# Patient Record
Sex: Female | Born: 1980 | Race: Black or African American | Hispanic: No | Marital: Married | Smoking: Current every day smoker
Health system: Southern US, Community
[De-identification: ages and names within clinical notes are randomized; demographics above are authoritative.]

## PROBLEM LIST (undated history)

## (undated) ENCOUNTER — Emergency Department (HOSPITAL_COMMUNITY): Admission: EM | Disposition: A | Discharge status: Home / Self Care | Payer: BC Managed Care – PPO

## (undated) DIAGNOSIS — F419 Anxiety disorder, unspecified: Secondary | ICD-10-CM

## (undated) DIAGNOSIS — R112 Nausea with vomiting, unspecified: Secondary | ICD-10-CM

## (undated) DIAGNOSIS — F32A Depression, unspecified: Secondary | ICD-10-CM

## (undated) DIAGNOSIS — Z9889 Other specified postprocedural states: Secondary | ICD-10-CM

## (undated) DIAGNOSIS — T7840XA Allergy, unspecified, initial encounter: Secondary | ICD-10-CM

## (undated) DIAGNOSIS — B019 Varicella without complication: Secondary | ICD-10-CM

## (undated) DIAGNOSIS — G43909 Migraine, unspecified, not intractable, without status migrainosus: Secondary | ICD-10-CM

## (undated) DIAGNOSIS — E739 Lactose intolerance, unspecified: Secondary | ICD-10-CM

## (undated) DIAGNOSIS — M545 Low back pain: Secondary | ICD-10-CM

## (undated) DIAGNOSIS — F329 Major depressive disorder, single episode, unspecified: Secondary | ICD-10-CM

## (undated) DIAGNOSIS — G4700 Insomnia, unspecified: Secondary | ICD-10-CM

## (undated) HISTORY — DX: Anxiety disorder, unspecified: F41.9

## (undated) HISTORY — PX: TONSILLECTOMY: SUR1361

## (undated) HISTORY — PX: BACK SURGERY: SHX140

## (undated) HISTORY — DX: Low back pain: M54.5

## (undated) HISTORY — DX: Major depressive disorder, single episode, unspecified: F32.9

## (undated) HISTORY — DX: Depression, unspecified: F32.A

## (undated) HISTORY — DX: Insomnia, unspecified: G47.00

## (undated) HISTORY — PX: TYMPANOSTOMY: SHX2586

## (undated) HISTORY — DX: Allergy, unspecified, initial encounter: T78.40XA

## (undated) HISTORY — DX: Varicella without complication: B01.9

## (undated) HISTORY — DX: Lactose intolerance, unspecified: E73.9

## (undated) HISTORY — DX: Migraine, unspecified, not intractable, without status migrainosus: G43.909

## (undated) HISTORY — PX: TONSILLECTOMY AND ADENOIDECTOMY: SHX28

---
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
## 2000-02-16 ENCOUNTER — Other Ambulatory Visit: Admission: RE | Admit: 2000-02-16 | Discharge: 2000-02-16 | Payer: Self-pay | Admitting: Internal Medicine

## 2000-11-29 DIAGNOSIS — M545 Low back pain, unspecified: Secondary | ICD-10-CM

## 2000-11-29 HISTORY — DX: Low back pain, unspecified: M54.50

## 2000-12-09 ENCOUNTER — Emergency Department (HOSPITAL_COMMUNITY): Admission: EM | Admit: 2000-12-09 | Discharge: 2000-12-09 | Payer: Self-pay | Admitting: Emergency Medicine

## 2000-12-09 ENCOUNTER — Encounter: Payer: Self-pay | Admitting: Emergency Medicine

## 2001-07-10 ENCOUNTER — Emergency Department (HOSPITAL_COMMUNITY): Admission: EM | Admit: 2001-07-10 | Discharge: 2001-07-10 | Payer: Self-pay | Admitting: Emergency Medicine

## 2001-11-07 ENCOUNTER — Other Ambulatory Visit: Admission: RE | Admit: 2001-11-07 | Discharge: 2001-11-07 | Payer: Self-pay | Admitting: Internal Medicine

## 2002-11-23 ENCOUNTER — Other Ambulatory Visit: Admission: RE | Admit: 2002-11-23 | Discharge: 2002-11-23 | Payer: Self-pay | Admitting: Internal Medicine

## 2004-12-17 ENCOUNTER — Other Ambulatory Visit: Admission: RE | Admit: 2004-12-17 | Discharge: 2004-12-17 | Payer: Self-pay | Admitting: Internal Medicine

## 2004-12-17 ENCOUNTER — Ambulatory Visit: Payer: Self-pay | Admitting: Internal Medicine

## 2005-01-26 ENCOUNTER — Ambulatory Visit: Payer: Self-pay | Admitting: Internal Medicine

## 2005-02-09 ENCOUNTER — Ambulatory Visit: Payer: Self-pay | Admitting: Family Medicine

## 2005-02-12 ENCOUNTER — Encounter: Admission: RE | Admit: 2005-02-12 | Discharge: 2005-02-12 | Payer: Self-pay | Admitting: Family Medicine

## 2005-03-11 ENCOUNTER — Ambulatory Visit: Payer: Self-pay | Admitting: Internal Medicine

## 2005-05-11 ENCOUNTER — Ambulatory Visit: Payer: Self-pay | Admitting: Internal Medicine

## 2005-06-07 ENCOUNTER — Ambulatory Visit: Payer: Self-pay | Admitting: Internal Medicine

## 2005-07-20 ENCOUNTER — Ambulatory Visit: Payer: Self-pay | Admitting: Family Medicine

## 2005-08-30 ENCOUNTER — Ambulatory Visit: Payer: Self-pay | Admitting: Internal Medicine

## 2005-09-13 ENCOUNTER — Ambulatory Visit: Payer: Self-pay | Admitting: Internal Medicine

## 2005-11-19 ENCOUNTER — Ambulatory Visit: Payer: Self-pay | Admitting: Internal Medicine

## 2005-12-20 ENCOUNTER — Ambulatory Visit: Payer: Self-pay | Admitting: Internal Medicine

## 2005-12-27 ENCOUNTER — Ambulatory Visit: Payer: Self-pay | Admitting: Internal Medicine

## 2005-12-27 ENCOUNTER — Other Ambulatory Visit: Admission: RE | Admit: 2005-12-27 | Discharge: 2005-12-27 | Payer: Self-pay | Admitting: Internal Medicine

## 2005-12-27 ENCOUNTER — Encounter: Payer: Self-pay | Admitting: Internal Medicine

## 2006-04-06 ENCOUNTER — Emergency Department (HOSPITAL_COMMUNITY): Admission: EM | Admit: 2006-04-06 | Discharge: 2006-04-06 | Payer: Self-pay | Admitting: Emergency Medicine

## 2006-08-22 ENCOUNTER — Emergency Department (HOSPITAL_COMMUNITY): Admission: EM | Admit: 2006-08-22 | Discharge: 2006-08-23 | Payer: Self-pay | Admitting: Emergency Medicine

## 2006-10-11 ENCOUNTER — Ambulatory Visit: Payer: Self-pay | Admitting: Internal Medicine

## 2007-07-28 ENCOUNTER — Emergency Department (HOSPITAL_COMMUNITY): Admission: EM | Admit: 2007-07-28 | Discharge: 2007-07-28 | Payer: Self-pay | Admitting: Emergency Medicine

## 2007-08-04 ENCOUNTER — Emergency Department (HOSPITAL_COMMUNITY): Admission: EM | Admit: 2007-08-04 | Discharge: 2007-08-04 | Payer: Self-pay | Admitting: Emergency Medicine

## 2007-09-11 ENCOUNTER — Emergency Department (HOSPITAL_COMMUNITY): Admission: EM | Admit: 2007-09-11 | Discharge: 2007-09-11 | Payer: Self-pay | Admitting: Emergency Medicine

## 2007-09-14 ENCOUNTER — Ambulatory Visit: Payer: Self-pay | Admitting: Internal Medicine

## 2007-09-20 ENCOUNTER — Ambulatory Visit: Payer: Self-pay | Admitting: *Deleted

## 2008-01-04 ENCOUNTER — Ambulatory Visit: Payer: Self-pay | Admitting: Internal Medicine

## 2008-01-04 ENCOUNTER — Encounter: Payer: Self-pay | Admitting: Internal Medicine

## 2008-01-04 LAB — CONVERTED CEMR LAB
BUN: 6 mg/dL (ref 6–23)
CO2: 24 meq/L (ref 19–32)
Calcium: 8.7 mg/dL (ref 8.4–10.5)
Chlamydia, DNA Probe: NEGATIVE
Chloride: 107 meq/L (ref 96–112)
Cholesterol: 162 mg/dL (ref 0–200)
Creatinine, Ser: 0.72 mg/dL (ref 0.40–1.20)
GC Probe Amp, Genital: NEGATIVE
Glucose, Bld: 94 mg/dL (ref 70–99)
HDL: 44 mg/dL (ref >39)
LDL Cholesterol: 87 mg/dL (ref 0–99)
Potassium: 4.3 meq/L (ref 3.5–5.3)
Preg, Serum: NEGATIVE
Sodium: 142 meq/L (ref 135–145)
Total CHOL/HDL Ratio: 3.7
Triglycerides: 153 mg/dL — ABNORMAL HIGH (ref <150)
VLDL: 31 mg/dL (ref 0–40)

## 2008-01-05 ENCOUNTER — Ambulatory Visit: Payer: Self-pay | Admitting: Internal Medicine

## 2008-02-06 ENCOUNTER — Ambulatory Visit: Payer: Self-pay | Admitting: Internal Medicine

## 2008-03-22 ENCOUNTER — Emergency Department (HOSPITAL_COMMUNITY): Admission: EM | Admit: 2008-03-22 | Discharge: 2008-03-22 | Payer: Self-pay | Admitting: Emergency Medicine

## 2008-03-28 ENCOUNTER — Ambulatory Visit: Payer: Self-pay | Admitting: Internal Medicine

## 2008-03-29 ENCOUNTER — Ambulatory Visit: Payer: Self-pay | Admitting: Internal Medicine

## 2008-05-08 ENCOUNTER — Ambulatory Visit: Payer: Self-pay | Admitting: Internal Medicine

## 2008-05-21 ENCOUNTER — Emergency Department (HOSPITAL_COMMUNITY): Admission: EM | Admit: 2008-05-21 | Discharge: 2008-05-21 | Payer: Self-pay | Admitting: Emergency Medicine

## 2008-05-23 ENCOUNTER — Ambulatory Visit: Payer: Self-pay | Admitting: Internal Medicine

## 2008-05-30 ENCOUNTER — Ambulatory Visit: Payer: Self-pay | Admitting: Internal Medicine

## 2008-06-19 ENCOUNTER — Ambulatory Visit: Payer: Self-pay | Admitting: Internal Medicine

## 2008-09-07 ENCOUNTER — Emergency Department (HOSPITAL_COMMUNITY): Admission: EM | Admit: 2008-09-07 | Discharge: 2008-09-07 | Payer: Self-pay | Admitting: Emergency Medicine

## 2008-10-07 ENCOUNTER — Emergency Department (HOSPITAL_COMMUNITY): Admission: EM | Admit: 2008-10-07 | Discharge: 2008-10-07 | Payer: Self-pay | Admitting: Family Medicine

## 2008-10-30 ENCOUNTER — Ambulatory Visit: Payer: Self-pay | Admitting: Internal Medicine

## 2008-10-30 LAB — CONVERTED CEMR LAB
BUN: 9 mg/dL (ref 6–23)
CO2: 22 meq/L (ref 19–32)
Glucose, Bld: 91 mg/dL (ref 70–99)
HCT: 41.7 % (ref 36.0–46.0)
Lymphocytes Relative: 31 % (ref 12–46)
Lymphs Abs: 2.8 10*3/uL (ref 0.7–4.0)
MCV: 85.3 fL (ref 78.0–100.0)
Monocytes Relative: 8 % (ref 3–12)
Neutrophils Relative %: 58 % (ref 43–77)
Platelets: 354 10*3/uL (ref 150–400)
Potassium: 4 meq/L (ref 3.5–5.3)
RBC: 4.89 M/uL (ref 3.87–5.11)
Sodium: 142 meq/L (ref 135–145)
WBC: 9.1 10*3/uL (ref 4.0–10.5)

## 2008-12-18 ENCOUNTER — Ambulatory Visit: Payer: Self-pay | Admitting: Internal Medicine

## 2008-12-18 ENCOUNTER — Encounter: Payer: Self-pay | Admitting: Internal Medicine

## 2008-12-18 LAB — CONVERTED CEMR LAB
BUN: 6 mg/dL (ref 6–23)
CO2: 21 meq/L (ref 19–32)
Calcium: 9.1 mg/dL (ref 8.4–10.5)
Chlamydia, DNA Probe: NEGATIVE
Chloride: 108 meq/L (ref 96–112)
Creatinine, Ser: 0.71 mg/dL (ref 0.40–1.20)
GC Probe Amp, Genital: NEGATIVE
Glucose, Bld: 94 mg/dL (ref 70–99)
HDL: 43 mg/dL (ref >39)
LDL Cholesterol: 93 mg/dL (ref 0–99)
TSH: 0.712 microintl units/mL (ref 0.350–4.50)

## 2009-07-02 ENCOUNTER — Emergency Department (HOSPITAL_COMMUNITY): Admission: EM | Admit: 2009-07-02 | Discharge: 2009-07-02 | Payer: Self-pay | Admitting: Family Medicine

## 2009-11-25 ENCOUNTER — Ambulatory Visit: Payer: Self-pay | Admitting: Family Medicine

## 2009-11-25 ENCOUNTER — Other Ambulatory Visit: Admission: RE | Admit: 2009-11-25 | Discharge: 2009-11-25 | Payer: Self-pay | Admitting: Family Medicine

## 2009-12-23 ENCOUNTER — Ambulatory Visit: Payer: Self-pay | Admitting: Family Medicine

## 2010-03-24 ENCOUNTER — Ambulatory Visit: Payer: Self-pay | Admitting: Family Medicine

## 2010-06-17 ENCOUNTER — Other Ambulatory Visit: Admission: RE | Admit: 2010-06-17 | Discharge: 2010-06-17 | Payer: Self-pay | Admitting: Internal Medicine

## 2010-06-17 ENCOUNTER — Ambulatory Visit: Payer: Self-pay | Admitting: Internal Medicine

## 2010-06-17 LAB — CONVERTED CEMR LAB
CO2: 26 meq/L (ref 19–32)
Calcium: 9.2 mg/dL (ref 8.4–10.5)
Creatinine, Ser: 0.71 mg/dL (ref 0.40–1.20)
GC Probe Amp, Genital: NEGATIVE

## 2010-09-07 ENCOUNTER — Ambulatory Visit: Payer: Self-pay | Admitting: Family Medicine

## 2010-11-12 ENCOUNTER — Ambulatory Visit: Payer: Self-pay | Admitting: Family Medicine

## 2010-12-02 ENCOUNTER — Ambulatory Visit
Admission: RE | Admit: 2010-12-02 | Discharge: 2010-12-02 | Payer: Self-pay | Source: Home / Self Care | Attending: Physician Assistant | Admitting: Physician Assistant

## 2010-12-14 ENCOUNTER — Ambulatory Visit: Admit: 2010-12-14 | Payer: Self-pay | Admitting: Family Medicine

## 2010-12-28 ENCOUNTER — Ambulatory Visit
Admission: RE | Admit: 2010-12-28 | Discharge: 2010-12-28 | Payer: Self-pay | Source: Home / Self Care | Attending: Family Medicine | Admitting: Family Medicine

## 2011-01-04 ENCOUNTER — Ambulatory Visit: Payer: Self-pay | Admitting: Physician Assistant

## 2011-02-04 ENCOUNTER — Ambulatory Visit (INDEPENDENT_AMBULATORY_CARE_PROVIDER_SITE_OTHER): Payer: 59 | Admitting: Family Medicine

## 2011-02-04 DIAGNOSIS — N390 Urinary tract infection, site not specified: Secondary | ICD-10-CM

## 2011-02-04 DIAGNOSIS — N760 Acute vaginitis: Secondary | ICD-10-CM

## 2011-02-04 DIAGNOSIS — R6889 Other general symptoms and signs: Secondary | ICD-10-CM

## 2011-02-18 ENCOUNTER — Ambulatory Visit: Payer: 59

## 2011-02-24 ENCOUNTER — Ambulatory Visit (INDEPENDENT_AMBULATORY_CARE_PROVIDER_SITE_OTHER): Payer: 59

## 2011-02-24 DIAGNOSIS — Z3009 Encounter for other general counseling and advice on contraception: Secondary | ICD-10-CM

## 2011-03-31 ENCOUNTER — Encounter: Payer: Self-pay | Admitting: Family Medicine

## 2011-03-31 DIAGNOSIS — G43909 Migraine, unspecified, not intractable, without status migrainosus: Secondary | ICD-10-CM | POA: Insufficient documentation

## 2011-03-31 DIAGNOSIS — E739 Lactose intolerance, unspecified: Secondary | ICD-10-CM | POA: Insufficient documentation

## 2011-03-31 DIAGNOSIS — G4700 Insomnia, unspecified: Secondary | ICD-10-CM | POA: Insufficient documentation

## 2011-05-18 ENCOUNTER — Other Ambulatory Visit: Payer: 59

## 2011-05-21 ENCOUNTER — Other Ambulatory Visit (INDEPENDENT_AMBULATORY_CARE_PROVIDER_SITE_OTHER): Payer: 59

## 2011-05-21 DIAGNOSIS — Z309 Encounter for contraceptive management, unspecified: Secondary | ICD-10-CM

## 2011-05-21 DIAGNOSIS — IMO0001 Reserved for inherently not codable concepts without codable children: Secondary | ICD-10-CM

## 2011-05-21 MED ORDER — MEDROXYPROGESTERONE ACETATE 150 MG/ML IM SUSP
150.0000 mg | Freq: Once | INTRAMUSCULAR | Status: AC
  Administered 2011-05-21: 150 mg via INTRAMUSCULAR

## 2011-07-21 ENCOUNTER — Encounter: Payer: Self-pay | Admitting: Family Medicine

## 2011-07-21 ENCOUNTER — Ambulatory Visit (INDEPENDENT_AMBULATORY_CARE_PROVIDER_SITE_OTHER): Payer: 59 | Admitting: Family Medicine

## 2011-07-21 VITALS — BP 124/80 | HR 72 | Temp 98.5°F | Ht 67.0 in | Wt 297.0 lb

## 2011-07-21 DIAGNOSIS — G43909 Migraine, unspecified, not intractable, without status migrainosus: Secondary | ICD-10-CM

## 2011-07-21 DIAGNOSIS — K529 Noninfective gastroenteritis and colitis, unspecified: Secondary | ICD-10-CM

## 2011-07-21 DIAGNOSIS — K5289 Other specified noninfective gastroenteritis and colitis: Secondary | ICD-10-CM

## 2011-07-21 DIAGNOSIS — F17200 Nicotine dependence, unspecified, uncomplicated: Secondary | ICD-10-CM
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
## 2011-07-21 NOTE — Patient Instructions (Signed)
Stay well hydrated--drink plenty of fluids.  Avoid dairy.  Avoid spicy and greasy foods.  Probiotics such as Align may help your bowels return to normal faster.  Bland diet  Keep track of your migraines.  Use either Excedrin Migraine or ibuprofen (800mg ) along with caffiene at first hint that you may be getting a headache.  If headache still develops, then use the Zomig early on.  Do not exceed 2 pills in 24 hours.  If you are having frequent headaches, return to discuss possible preventative medications.  If the Zomig isn't effective, we can try different medications.  Since your insurance seems to not cover it, please call them and find out which alteratives ARE covered. It sounds like nasal imitrex was effective in the past  You have some upper respiratory congestion--you may use sudafed or other decongestant to help with congestion, dry up the postnasal drip which is contributing to cough.  You may also use Mucinex to thin out the mucus/phlegm  Return for worsening symptoms, high fever, blood in stool, or other new concerns

## 2011-07-30 ENCOUNTER — Telehealth: Payer: Self-pay | Admitting: Family Medicine

## 2011-07-30 DIAGNOSIS — G43909 Migraine, unspecified, not intractable, without status migrainosus: Secondary | ICD-10-CM

## 2011-07-30 MED ORDER — SUMATRIPTAN SUCCINATE 100 MG PO TABS: 100.0000 mg | ORAL_TABLET | Freq: Once | ORAL | Status: DC | PRN

## 2011-07-30 NOTE — Telephone Encounter (Signed)
imitrex Rx sent to her pharmacy

## 2011-08-18 ENCOUNTER — Other Ambulatory Visit: Payer: 59

## 2011-09-09 ENCOUNTER — Ambulatory Visit (INDEPENDENT_AMBULATORY_CARE_PROVIDER_SITE_OTHER): Payer: 59 | Admitting: Family Medicine

## 2011-09-09 ENCOUNTER — Encounter: Payer: Self-pay | Admitting: Family Medicine

## 2011-09-09 DIAGNOSIS — G4700 Insomnia, unspecified: Secondary | ICD-10-CM

## 2011-09-09 DIAGNOSIS — J309 Allergic rhinitis, unspecified: Secondary | ICD-10-CM | POA: Insufficient documentation

## 2011-09-09 DIAGNOSIS — G43909 Migraine, unspecified, not intractable, without status migrainosus: Secondary | ICD-10-CM | POA: Insufficient documentation

## 2011-09-09 DIAGNOSIS — R350 Frequency of micturition: Secondary | ICD-10-CM

## 2011-09-09 LAB — POCT URINALYSIS DIPSTICK
Bilirubin, UA: NEGATIVE
Glucose, UA: NEGATIVE
Leukocytes, UA: NEGATIVE
Nitrite, UA: NEGATIVE

## 2011-09-09 MED ORDER — NORTRIPTYLINE HCL 10 MG PO CAPS: ORAL_CAPSULE | ORAL | Status: DC

## 2011-09-09 NOTE — Patient Instructions (Addendum)
Cut back caffeine during the week.  (switch to diet soda to avoid further weight gain) Restart Flonase.  Can continue the sudafed at least for the first week or so back on Flonase.  Then can back down to just prn use Take the nortriptylene before you go to sleep. Daily exercise is recommended  Call if you need a refill on the Ambien--I suspect the nortriptylene will help you fall asleep  Work on quitting smoking!  Consider tapering down 1 cigarette weekly.  Use 1800QUITNOW or NCQUitline.com for free counseling
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
## 2011-09-22 ENCOUNTER — Telehealth: Payer: Self-pay | Admitting: *Deleted

## 2011-09-22 NOTE — Telephone Encounter (Signed)
Left message for patient to return my call ZO:XWRUE issues. Also asked her to call to schedule OV with Dr.Knapp to recheck urine.

## 2011-09-22 NOTE — Telephone Encounter (Signed)
Patient called and stated that she has been taking the nortriptyline 10mg  daily and she is still having sleep issues, not able to stay asleep. Would like to know if you can call in zolpidem as it was working for her. Also she has cut back on her soda intake tremendously, and took AZO(she has relief while on AZO, but once off symptoms came back). She is having intermittent burning with urination and still having the urgency, thinks she has UTI and wants to know if you can call something in. She uses [facility]. Thanks.

## 2011-09-22 NOTE — Telephone Encounter (Signed)
Re: sleep issues.  She had been instructed to increase the nortriptyline to 2 at bedtime (20mg ) if not having side effects (too much sedation, or other problems) at the 10mg  dose, after taking it for a week.  If she has already tried that, and still not helping with sleep, then okay to call in refill of #30 of zolpidem.  Regarding her urinary symptoms, I'd like repeat urine specimen.  We have 2pm available opening for OV this afternoon, or can put her in at 4pm if that is better for her.

## 2011-09-23 ENCOUNTER — Telehealth: Payer: Self-pay | Admitting: *Deleted

## 2011-09-23 DIAGNOSIS — G4700 Insomnia, unspecified: Secondary | ICD-10-CM

## 2011-09-23 MED ORDER — ZOLPIDEM TARTRATE 10 MG PO TABS: 10.0000 mg | ORAL_TABLET | Freq: Every evening | ORAL | Status: DC | PRN

## 2011-09-23 NOTE — Telephone Encounter (Signed)
Called patient to let her know I called in Ambien 10mg  #30 qhs-prn Shawna Kennedy to [facility] and she that it was recommended that she schedule appt for UA follow up, she may not drop off specimen.

## 2011-09-23 NOTE — Telephone Encounter (Signed)
Patient called back and stated that she was not comfortable going up to 2 on the nortriptyline, she said she is having mood swings. Really would like to call in Ambien if possible. [facility] uses [facility].  She said she cannot afford to schedule OV to have urine rechecked, can she just drop off specimen? Or she has an appt scheduled 10/13/11, would that be ok to recheck UA then?

## 2011-09-23 NOTE — Telephone Encounter (Signed)
Okay for Ambien.  It is up to her if she chooses to wait until November appointment.  My rec is OV now.  Not acceptable to just drop off specimen when we have open appointments.

## 2011-10-12 ENCOUNTER — Other Ambulatory Visit: Payer: Self-pay | Admitting: Gynecology

## 2011-10-13 ENCOUNTER — Ambulatory Visit: Payer: 59 | Admitting: Family Medicine

## 2011-10-27 ENCOUNTER — Other Ambulatory Visit: Payer: Self-pay | Admitting: Family Medicine

## 2011-10-27 DIAGNOSIS — G4700 Insomnia, unspecified: Secondary | ICD-10-CM

## 2011-10-27 MED ORDER — ZOLPIDEM TARTRATE 10 MG PO TABS: 10.0000 mg | ORAL_TABLET | Freq: Every evening | ORAL | Status: DC | PRN

## 2011-10-27 NOTE — Telephone Encounter (Signed)
This was in rx requests. I think she missed her last appt, did you want to refill?

## 2011-10-27 NOTE — Telephone Encounter (Signed)
Okay to refill just this once.  This is not supposed to be a daily medication longterm.  If needing it every day, will need OV to discuss safety and alternatives

## 2011-10-29 ENCOUNTER — Telehealth: Payer: Self-pay | Admitting: Medical

## 2011-10-29 NOTE — Telephone Encounter (Signed)
Chart shows this was refilled 10/27/11

## 2011-10-29 NOTE — Telephone Encounter (Signed)
Pt called and informed rx at [facility]

## 2011-11-30 ENCOUNTER — Emergency Department (INDEPENDENT_AMBULATORY_CARE_PROVIDER_SITE_OTHER)
Admission: EM | Admit: 2011-11-30 | Discharge: 2011-11-30 | Disposition: A | Discharge status: Home / Self Care | Payer: 59 | Source: Home / Self Care | Attending: Emergency Medicine | Admitting: Emergency Medicine

## 2011-11-30 ENCOUNTER — Encounter (HOSPITAL_COMMUNITY): Payer: Self-pay | Admitting: *Deleted

## 2011-11-30 DIAGNOSIS — H6693 Otitis media, unspecified, bilateral: Secondary | ICD-10-CM

## 2011-11-30 DIAGNOSIS — H6690 Otitis media, unspecified, unspecified ear: Secondary | ICD-10-CM

## 2011-11-30 MED ORDER — IPRATROPIUM BROMIDE 0.06 % NA SOLN: 2.0000 | Freq: Four times a day (QID) | NASAL | Status: DC

## 2011-11-30 MED ORDER — SULFAMETHOXAZOLE-TRIMETHOPRIM 800-160 MG PO TABS: 1.0000 | ORAL_TABLET | Freq: Two times a day (BID) | ORAL | Status: AC

## 2011-11-30 NOTE — ED Notes (Signed)
Cough/congestion/earache/chills  onset x one week - increasingly worse
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
## 2011-12-06 ENCOUNTER — Telehealth: Payer: Self-pay | Admitting: Family Medicine

## 2011-12-06 DIAGNOSIS — G4700 Insomnia, unspecified: Secondary | ICD-10-CM

## 2011-12-06 MED ORDER — ZOLPIDEM TARTRATE 10 MG PO TABS: 10.0000 mg | ORAL_TABLET | Freq: Every evening | ORAL | Status: DC | PRN

## 2011-12-06 NOTE — Telephone Encounter (Signed)
Called in Ambien 10mg  #30 qhs 0 refills to [facility] spoke with Shiny. Called patient to schedule follow up appt for further refills, left message on her voicemail to call our office to schedule appt for further refills.

## 2011-12-06 NOTE — Telephone Encounter (Signed)
This is not intended for long-term daily, chronic use.  Okay to refill x 1.  Needs to schedule f/u for further refills.  Please schedule f/u appt

## 2011-12-07 ENCOUNTER — Ambulatory Visit: Payer: 59 | Admitting: Medical

## 2011-12-13 ENCOUNTER — Encounter: Payer: Self-pay | Admitting: Family Medicine

## 2011-12-13 ENCOUNTER — Ambulatory Visit (INDEPENDENT_AMBULATORY_CARE_PROVIDER_SITE_OTHER): Payer: 59 | Admitting: Family Medicine

## 2011-12-13 VITALS — BP 128/84 | HR 76 | Ht 67.0 in | Wt 313.0 lb

## 2011-12-13 DIAGNOSIS — Z888 Allergy status to other drugs, medicaments and biological substances status: Secondary | ICD-10-CM

## 2011-12-13 DIAGNOSIS — IMO0002 Reserved for concepts with insufficient information to code with codable children: Secondary | ICD-10-CM

## 2011-12-13 MED ORDER — METHYLPREDNISOLONE 4 MG PO KIT: PACK | ORAL | Status: AC

## 2011-12-13 NOTE — Patient Instructions (Signed)
Take the steroid as directed--expect some side effects initially (increased hunger, trouble sleeping).  Call if rash recurs after finishing course. You can try Zyrtec and/or Benadryl, but if this isn't resolving the reaction, then may need longer course of steroids
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
## 2012-01-13 ENCOUNTER — Encounter: Payer: Self-pay | Admitting: Family Medicine

## 2012-01-13 ENCOUNTER — Ambulatory Visit (INDEPENDENT_AMBULATORY_CARE_PROVIDER_SITE_OTHER): Payer: 59 | Admitting: Family Medicine

## 2012-01-13 DIAGNOSIS — F17200 Nicotine dependence, unspecified, uncomplicated: Secondary | ICD-10-CM

## 2012-01-13 DIAGNOSIS — F329 Major depressive disorder, single episode, unspecified: Secondary | ICD-10-CM

## 2012-01-13 DIAGNOSIS — F411 Generalized anxiety disorder: Secondary | ICD-10-CM

## 2012-01-13 DIAGNOSIS — R002 Palpitations: Secondary | ICD-10-CM

## 2012-01-13 DIAGNOSIS — G4700 Insomnia, unspecified: Secondary | ICD-10-CM

## 2012-01-13 MED ORDER — ZOLPIDEM TARTRATE ER 12.5 MG PO TBCR: 12.5000 mg | EXTENDED_RELEASE_TABLET | Freq: Every evening | ORAL | Status: DC | PRN

## 2012-01-13 MED ORDER — CITALOPRAM HYDROBROMIDE 20 MG PO TABS: ORAL_TABLET | ORAL | Status: DC

## 2012-01-13 MED ORDER — ALPRAZOLAM 0.25 MG PO TABS: 0.2500 mg | ORAL_TABLET | Freq: Three times a day (TID) | ORAL | Status: DC | PRN

## 2012-01-13 NOTE — Patient Instructions (Signed)
Start getting regular exercise, and other stress reduction techniques. Increase to full tablet if not having side effects of celexa after a week Only use the xanax sparingly, if needed for anxiety attack/shortness of breath We are changing to Ambien CR because that is safer for longterm use.  Ideally, if you don't need it every day, as the other medications take effect, then you don't need to take it daily
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
## 2012-01-16 DIAGNOSIS — F17200 Nicotine dependence, unspecified, uncomplicated: Secondary | ICD-10-CM | POA: Insufficient documentation

## 2012-01-16 DIAGNOSIS — F411 Generalized anxiety disorder: Secondary | ICD-10-CM | POA: Insufficient documentation

## 2012-01-26 ENCOUNTER — Telehealth: Payer: Self-pay | Admitting: Internal Medicine

## 2012-01-26 DIAGNOSIS — F411 Generalized anxiety disorder: Secondary | ICD-10-CM

## 2012-01-26 MED ORDER — ALPRAZOLAM 0.25 MG PO TABS: 0.2500 mg | ORAL_TABLET | Freq: Three times a day (TID) | ORAL | Status: DC | PRN

## 2012-01-26 NOTE — Telephone Encounter (Signed)
Called patient to let her know that Dr.Knapp ok'd me to call in #15 of alprazolam and wanted to know if she was taking citalopram, I left message asking her to return my call.

## 2012-01-26 NOTE — Telephone Encounter (Signed)
She has taken 15 pills in the 2 weeks since her visit.  Ensure that she is still taking the preventative med that was started.  Okay to fill #15

## 2012-02-10 ENCOUNTER — Ambulatory Visit: Payer: 59 | Admitting: Family Medicine

## 2012-02-24 ENCOUNTER — Encounter: Payer: Self-pay | Admitting: Family Medicine

## 2012-02-24 ENCOUNTER — Ambulatory Visit (INDEPENDENT_AMBULATORY_CARE_PROVIDER_SITE_OTHER): Payer: 59 | Admitting: Family Medicine

## 2012-02-24 VITALS — BP 138/98 | HR 68 | Ht 67.0 in | Wt 315.0 lb

## 2012-02-24 DIAGNOSIS — G43909 Migraine, unspecified, not intractable, without status migrainosus: Secondary | ICD-10-CM

## 2012-02-24 DIAGNOSIS — F17200 Nicotine dependence, unspecified, uncomplicated: Secondary | ICD-10-CM

## 2012-02-24 DIAGNOSIS — F329 Major depressive disorder, single episode, unspecified: Secondary | ICD-10-CM

## 2012-02-24 DIAGNOSIS — G4700 Insomnia, unspecified: Secondary | ICD-10-CM

## 2012-02-24 DIAGNOSIS — F411 Generalized anxiety disorder: Secondary | ICD-10-CM

## 2012-02-24 MED ORDER — CITALOPRAM HYDROBROMIDE 40 MG PO TABS: ORAL_TABLET | ORAL | Status: DC

## 2012-02-24 MED ORDER — ALPRAZOLAM 0.25 MG PO TABS: 0.2500 mg | ORAL_TABLET | Freq: Three times a day (TID) | ORAL | Status: AC | PRN
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
## 2012-02-24 NOTE — Patient Instructions (Addendum)
Anxiety--increase citalopram to 40 mg daily (can use up 20mg  tablets by taking 2 at a time, new rx is for 40 mg tablets)  Migraines--prefers to retry imitrex rather than change to different med.  Can try taking 1/2 tablet at onset of headache, and other 1/2 2 hours later, if needed.  If you continue to not tolerate imitrex, call to change med--check with insurance to see which other "triptans" are covered (ie Zomig, maxalt, etc.)  Tobacco abuse--encouraged cessation.  If using patches--use lowest dose, vs cutting back by 1 cigarette per week.  Elevated bp--low sodium diet, daily exercise, weight loss.  Try and check BP elsewhere.  Discuss at f/u next month if still elevated  2 Gram Low Sodium Diet A 2 gram sodium diet restricts the amount of sodium in the diet to no more than 2 g or 2000 mg daily. Limiting the amount of sodium is often used to help lower blood pressure. It is important if you have heart, liver, or kidney problems. Many foods contain sodium for flavor and sometimes as a preservative. When the amount of sodium in a diet needs to be low, it is important to know what to look for when choosing foods and drinks. The following includes some information and guidelines to help make it easier for you to adapt to a low sodium diet. QUICK TIPS  Do not add salt to food.   Avoid convenience items and fast food.   Choose unsalted snack foods.   Buy lower sodium products, often labeled as "lower sodium" or "no salt added."   Check food labels to learn how much sodium is in 1 serving.   When eating at a restaurant, ask that your food be prepared with less salt or none, if possible.  READING FOOD LABELS FOR SODIUM INFORMATION The nutrition facts label is a good place to find how much sodium is in foods. Look for products with no more than 500 to 600 mg of sodium per meal and no more than 150 mg per serving. Remember that 2 g = 2000 mg. The food label may also list foods as:  Sodium-free:  Less than 5 mg in a serving.   Very low sodium: 35 mg or less in a serving.   Low-sodium: 140 mg or less in a serving.   Light in sodium: 50% less sodium in a serving. For example, if a food that usually has 300 mg of sodium is changed to become light in sodium, it will have 150 mg of sodium.   Reduced sodium: 25% less sodium in a serving. For example, if a food that usually has 400 mg of sodium is changed to reduced sodium, it will have 300 mg of sodium.  CHOOSING FOODS Grains  Avoid: Salted crackers and snack items. Some cereals, including instant hot cereals. Bread stuffing and biscuit mixes. Seasoned rice or pasta mixes.   Choose: Unsalted snack items. Low-sodium cereals, oats, puffed wheat and rice, shredded wheat. English muffins and bread. Pasta.  Meats  Avoid: Salted, canned, smoked, spiced, pickled meats, including fish and poultry. Bacon, ham, sausage, cold cuts, hot dogs, anchovies.   Choose: Low-sodium canned tuna and salmon. Fresh or frozen meat, poultry, and fish.  Dairy  Avoid: Processed cheese and spreads. Cottage cheese. Buttermilk and condensed milk. Regular cheese.   Choose: Milk. Low-sodium cottage cheese. Yogurt. Sour cream. Low-sodium cheese.  Fruits and Vegetables  Avoid: Regular canned vegetables. Regular canned tomato sauce and paste. Frozen vegetables in sauces. Olives. Rosita Fire.  Relishes. Sauerkraut.   Choose: Low-sodium canned vegetables. Low-sodium tomato sauce and paste. Frozen or fresh vegetables. Fresh and frozen fruit.  Condiments  Avoid: Canned and packaged gravies. Worcestershire sauce. Tartar sauce. Barbecue sauce. Soy sauce. Steak sauce. Ketchup. Onion, garlic, and table salt. Meat flavorings and tenderizers.   Choose: Fresh and dried herbs and spices. Low-sodium varieties of mustard and ketchup. Lemon juice. Tabasco sauce. Horseradish.  SAMPLE 2 GRAM SODIUM MEAL PLAN Breakfast / Sodium (mg)  1 cup low-fat milk / 143 mg   2 slices  whole-wheat toast / 270 mg   1 tbs heart-healthy margarine / 153 mg   1 hard-boiled egg / 139 mg   1 small orange / 0 mg  Lunch / Sodium (mg)  1 cup raw carrots / 76 mg    cup hummus / 298 mg   1 cup low-fat milk / 143 mg    cup red grapes / 2 mg   1 whole-wheat pita bread / 356 mg  Dinner / Sodium (mg)  1 cup whole-wheat pasta / 2 mg   1 cup low-sodium tomato sauce / 73 mg   3 oz lean ground beef / 57 mg   1 small side salad (1 cup raw spinach leaves,  cup cucumber,  cup yellow bell pepper) with 1 tsp olive oil and 1 tsp red wine vinegar / 25 mg  Snack / Sodium (mg)  1 container low-fat vanilla yogurt / 107 mg   3 graham cracker squares / 127 mg  Nutrient Analysis  Calories: 2033   Protein: 77 g   Carbohydrate: 282 g   Fat: 72 g   Sodium: 1971 mg  Document Released: 11/15/2005 Document Revised: 11/04/2011 Document Reviewed: 02/16/2010 [facility] Patient Information 2012 [place], [place].

## 2012-03-08 ENCOUNTER — Encounter: Payer: Self-pay | Admitting: Medical

## 2012-03-08 ENCOUNTER — Ambulatory Visit (INDEPENDENT_AMBULATORY_CARE_PROVIDER_SITE_OTHER): Payer: 59 | Admitting: Medical

## 2012-03-08 VITALS — BP 130/88 | HR 78 | Temp 98.6°F | Resp 16 | Wt 310.0 lb

## 2012-03-08 DIAGNOSIS — N760 Acute vaginitis: Secondary | ICD-10-CM

## 2012-03-08 MED ORDER — FLUCONAZOLE 150 MG PO TABS: ORAL_TABLET | ORAL | Status: DC
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
## 2012-03-23 ENCOUNTER — Ambulatory Visit: Payer: 59 | Admitting: Family Medicine

## 2012-03-27 ENCOUNTER — Encounter: Payer: Self-pay | Admitting: Family Medicine

## 2012-04-12 ENCOUNTER — Encounter: Payer: Self-pay | Admitting: Family Medicine

## 2012-04-12 ENCOUNTER — Ambulatory Visit (INDEPENDENT_AMBULATORY_CARE_PROVIDER_SITE_OTHER): Payer: 59 | Admitting: Family Medicine

## 2012-04-12 VITALS — BP 124/88 | HR 84 | Temp 98.1°F | Ht 67.0 in | Wt 311.0 lb

## 2012-04-12 DIAGNOSIS — L299 Pruritus, unspecified: Secondary | ICD-10-CM

## 2012-04-12 DIAGNOSIS — F17200 Nicotine dependence, unspecified, uncomplicated: Secondary | ICD-10-CM

## 2012-04-12 DIAGNOSIS — F411 Generalized anxiety disorder: Secondary | ICD-10-CM

## 2012-04-12 DIAGNOSIS — J309 Allergic rhinitis, unspecified: Secondary | ICD-10-CM

## 2012-04-12 DIAGNOSIS — G43909 Migraine, unspecified, not intractable, without status migrainosus: Secondary | ICD-10-CM

## 2012-04-12 DIAGNOSIS — G4700 Insomnia, unspecified: Secondary | ICD-10-CM

## 2012-04-12 MED ORDER — CITALOPRAM HYDROBROMIDE 40 MG PO TABS: 40.0000 mg | ORAL_TABLET | Freq: Every day | ORAL | Status: DC

## 2012-04-12 MED ORDER — ZOLPIDEM TARTRATE ER 12.5 MG PO TBCR: 12.5000 mg | EXTENDED_RELEASE_TABLET | Freq: Every evening | ORAL | Status: DC | PRN

## 2012-04-12 MED ORDER — HYDROXYZINE HCL 50 MG PO TABS: ORAL_TABLET | ORAL | Status: DC

## 2012-04-12 NOTE — Patient Instructions (Signed)
Quit smoking.  Stop the claritin-D.  Change to Zyrtec (and can restart nasal sprays).  Trial of Aveeno baths.  Consider OTC anti-itch cream (with menthol)--ie Sarna. Trial of atarax--may cause sedation. Continue to keep skin well moisturized.  Avoid prolonged hot baths or showers.  

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
## 2012-05-05 ENCOUNTER — Other Ambulatory Visit: Payer: Self-pay | Admitting: Family Medicine

## 2012-05-05 NOTE — Telephone Encounter (Signed)
Deny--she was changed to a 90 day rx and given #90 with 1 refill in mid-May.  This old 30-day rx shouldn't need to be refilled.

## 2012-05-05 NOTE — Telephone Encounter (Signed)
Is this ok?

## 2012-05-30 ENCOUNTER — Ambulatory Visit (INDEPENDENT_AMBULATORY_CARE_PROVIDER_SITE_OTHER): Payer: 59 | Admitting: Medical

## 2012-05-30 ENCOUNTER — Encounter: Payer: Self-pay | Admitting: Medical

## 2012-05-30 VITALS — BP 130/82 | HR 68 | Temp 98.1°F | Resp 16 | Wt 316.0 lb

## 2012-05-30 DIAGNOSIS — F419 Anxiety disorder, unspecified: Secondary | ICD-10-CM

## 2012-05-30 DIAGNOSIS — L299 Pruritus, unspecified: Secondary | ICD-10-CM

## 2012-05-30 DIAGNOSIS — F411 Generalized anxiety disorder: Secondary | ICD-10-CM

## 2012-05-30 MED ORDER — DULOXETINE HCL 30 MG PO CPEP: 30.0000 mg | ORAL_CAPSULE | Freq: Every day | ORAL | Status: DC

## 2012-05-30 MED ORDER — HYDROXYZINE HCL 50 MG PO TABS: ORAL_TABLET | ORAL | Status: DC

## 2012-05-30 NOTE — Patient Instructions (Signed)
Wean off Citalopram/Celexa 40mg  by taking 1/2 tablet daily for 1 week, then stop.  Begin Cymbalta samples by taking the 30mg  capsules daily for 1week, then increase to the 60mg  capsule daily after week 1.
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
## 2012-06-23 ENCOUNTER — Encounter: Payer: Self-pay | Admitting: Medical

## 2012-06-23 ENCOUNTER — Ambulatory Visit (INDEPENDENT_AMBULATORY_CARE_PROVIDER_SITE_OTHER): Payer: 59 | Admitting: Medical

## 2012-06-23 VITALS — BP 124/78 | HR 84 | Temp 98.2°F | Resp 16 | Wt 317.0 lb

## 2012-06-23 DIAGNOSIS — G4700 Insomnia, unspecified: Secondary | ICD-10-CM

## 2012-06-23 DIAGNOSIS — L299 Pruritus, unspecified: Secondary | ICD-10-CM

## 2012-06-23 DIAGNOSIS — F411 Generalized anxiety disorder: Secondary | ICD-10-CM

## 2012-06-23 DIAGNOSIS — F419 Anxiety disorder, unspecified: Secondary | ICD-10-CM

## 2012-06-23 MED ORDER — ZOLPIDEM TARTRATE ER 12.5 MG PO TBCR: 12.5000 mg | EXTENDED_RELEASE_TABLET | Freq: Every evening | ORAL | Status: DC | PRN

## 2012-06-23 MED ORDER — DULOXETINE HCL 60 MG PO CPEP: 60.0000 mg | ORAL_CAPSULE | Freq: Every day | ORAL | Status: DC
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
## 2012-06-27 ENCOUNTER — Ambulatory Visit (INDEPENDENT_AMBULATORY_CARE_PROVIDER_SITE_OTHER): Payer: 59 | Admitting: Family Medicine

## 2012-06-27 ENCOUNTER — Encounter: Payer: Self-pay | Admitting: Family Medicine

## 2012-06-27 VITALS — BP 110/70 | HR 87 | Wt 316.0 lb

## 2012-06-27 DIAGNOSIS — F411 Generalized anxiety disorder: Secondary | ICD-10-CM

## 2012-06-27 DIAGNOSIS — G43909 Migraine, unspecified, not intractable, without status migrainosus: Secondary | ICD-10-CM

## 2012-06-27 MED ORDER — ELETRIPTAN HYDROBROMIDE 40 MG PO TABS: 40.0000 mg | ORAL_TABLET | ORAL | Status: DC | PRN

## 2012-06-27 NOTE — Patient Instructions (Addendum)
If your headache does not respond to the Relpax, take it again in 2-4 hours and let us know. We will wait to hear from you.

## 2012-06-27 NOTE — Progress Notes (Signed)
  Subjective:    Patient ID: Shawna Kennedy, female    DOB: 15-Sep-1981, 31 y.o.   MRN: [id number]  HPI He is here for consult concerning continued difficulty with migraine headache. Recently her medications were readjusted and she is now taking Cymbalta. She states that this is helping her psychologically and she feels quite happy but has noted difficulty with migraine headaches. She also states that she is unable to afford the cost of Cymbalta and would like to be switched to something different. Normally when she has a headache she will take an Imitrex which does make her sleepy but apparently does not take her headaches away completely. This does cause her to occasionally miss work.  Review of Systems     Objective:   Physical Exam Alert and in no distress otherwise not examined       Assessment & Plan:   1. Migraine headache   2. Anxiety state, unspecified    a sample of Relpax given and instructions on taking the medication again. Also gave a discount card. Discussed possibly switching her did to a different medicine however I will defer that to Dr. Lynelle Doctor.

## 2012-07-26 ENCOUNTER — Ambulatory Visit: Payer: 59 | Admitting: Family Medicine

## 2012-07-26 ENCOUNTER — Telehealth: Payer: Self-pay | Admitting: Internal Medicine

## 2012-07-26 NOTE — Telephone Encounter (Signed)
She has missed multiple appointments (and in process of being discharged due to no-shows).  I have plenty of availability to work her in tomorrow, or Shawna Kennedy can see her Friday, or appt next week.  Message isn't clear as to which medication is needed--?Cymbalta, vs Relpax, vs Imitrex.  Can provide samples until appt next week if that is what she prefers.

## 2012-07-26 NOTE — Telephone Encounter (Signed)
Spoke with patient and she will be in tomorrow @ 11:15am.

## 2012-07-26 NOTE — Telephone Encounter (Signed)
pt states that she missed her appt today and the first appt is wed sept 4 and pt needs something for her migraines. Pt would like something if we have samples of it and if not would like something sent in to [facility] because her med is about to run out

## 2012-07-27 ENCOUNTER — Encounter: Payer: Self-pay | Admitting: Family Medicine

## 2012-07-27 ENCOUNTER — Ambulatory Visit (INDEPENDENT_AMBULATORY_CARE_PROVIDER_SITE_OTHER): Payer: 59 | Admitting: Family Medicine

## 2012-07-27 VITALS — BP 108/72 | HR 84 | Ht 67.0 in | Wt 310.0 lb

## 2012-07-27 DIAGNOSIS — F17200 Nicotine dependence, unspecified, uncomplicated: Secondary | ICD-10-CM

## 2012-07-27 DIAGNOSIS — G43909 Migraine, unspecified, not intractable, without status migrainosus: Secondary | ICD-10-CM

## 2012-07-27 DIAGNOSIS — Z6841 Body Mass Index (BMI) 40.0 and over, adult: Secondary | ICD-10-CM

## 2012-07-27 DIAGNOSIS — F411 Generalized anxiety disorder: Secondary | ICD-10-CM

## 2012-07-27 MED ORDER — TOPIRAMATE 25 MG PO TABS: ORAL_TABLET | ORAL | Status: DC

## 2012-07-27 MED ORDER — ELETRIPTAN HYDROBROMIDE 40 MG PO TABS: 40.0000 mg | ORAL_TABLET | ORAL | Status: DC | PRN

## 2012-07-27 MED ORDER — SERTRALINE HCL 50 MG PO TABS: ORAL_TABLET | ORAL | Status: DC

## 2012-07-27 MED ORDER — DULOXETINE HCL 30 MG PO CPEP: 30.0000 mg | ORAL_CAPSULE | Freq: Every day | ORAL | Status: DC

## 2012-07-27 NOTE — Patient Instructions (Signed)
Decrease Cymbalta dose to 30mg  daily, and start taking 1/2 of 50 mg sertraline tablet.  Do this overlap for 1 week.  Then stop the Cymbalta entirely, and increase the Sertraline to full tablet at bedtime.  This is a medication to help treat the anxiety, in place of citalopram or cymbalta.  Start topamax 1 tablet at bedtime.  After a week, increased to taking it twice daily.  If you are still having headaches, this can further be increased to taking 1 in the morning and 2 at bedtime.  Max recommended dose for prevention of migraines is 50 mg twice daily, but you need to gradually work up to this dose, and you don't NEED to get that high if migraines are being well controlled with a lower dose, or if limited by side effects.  It is NOT recommended for pregnancy while on topamax (significant problems can occur), so it is very important that you do not get pregnant while taking topamax.
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
## 2012-08-01 ENCOUNTER — Encounter: Payer: Self-pay | Admitting: Family Medicine

## 2012-08-02 ENCOUNTER — Ambulatory Visit: Payer: 59 | Admitting: Family Medicine

## 2012-08-09 ENCOUNTER — Telehealth: Payer: Self-pay | Admitting: Family Medicine

## 2012-08-09 NOTE — Telephone Encounter (Signed)
LM

## 2012-12-01 ENCOUNTER — Other Ambulatory Visit: Payer: Self-pay | Admitting: Gynecology

## 2013-01-18 ENCOUNTER — Ambulatory Visit
Admission: RE | Admit: 2013-01-18 | Discharge: 2013-01-18 | Disposition: A | Discharge status: Home / Self Care | Payer: 59 | Source: Ambulatory Visit | Attending: Physician Assistant | Admitting: Physician Assistant

## 2013-01-18 ENCOUNTER — Other Ambulatory Visit: Payer: Self-pay | Admitting: Physician Assistant

## 2013-01-18 DIAGNOSIS — R52 Pain, unspecified: Secondary | ICD-10-CM

## 2013-03-15 ENCOUNTER — Emergency Department: Payer: Self-pay | Admitting: Emergency Medicine

## 2013-03-20 ENCOUNTER — Other Ambulatory Visit: Payer: Self-pay | Admitting: Family Medicine

## 2013-03-20 DIAGNOSIS — M5430 Sciatica, unspecified side: Secondary | ICD-10-CM

## 2013-03-20 DIAGNOSIS — M545 Low back pain: Secondary | ICD-10-CM

## 2013-03-22 ENCOUNTER — Ambulatory Visit
Admission: RE | Admit: 2013-03-22 | Discharge: 2013-03-22 | Disposition: A | Discharge status: Home / Self Care | Payer: 59 | Source: Ambulatory Visit | Attending: Family Medicine | Admitting: Family Medicine

## 2013-03-22 DIAGNOSIS — M5430 Sciatica, unspecified side: Secondary | ICD-10-CM

## 2013-03-22 DIAGNOSIS — M545 Low back pain: Secondary | ICD-10-CM

## 2013-04-25 ENCOUNTER — Other Ambulatory Visit (HOSPITAL_COMMUNITY): Payer: Self-pay | Admitting: Orthopaedic Surgery

## 2013-04-26 ENCOUNTER — Encounter (HOSPITAL_COMMUNITY)
Admission: RE | Admit: 2013-04-26 | Discharge: 2013-04-26 | Disposition: A | Discharge status: Home / Self Care | Payer: 59 | Source: Ambulatory Visit | Attending: Orthopaedic Surgery | Admitting: Orthopaedic Surgery

## 2013-04-26 ENCOUNTER — Encounter (HOSPITAL_COMMUNITY): Payer: Self-pay

## 2013-04-26 ENCOUNTER — Ambulatory Visit (HOSPITAL_COMMUNITY)
Admission: RE | Admit: 2013-04-26 | Discharge: 2013-04-26 | Disposition: A | Discharge status: Home / Self Care | Payer: 59 | Source: Ambulatory Visit | Attending: Orthopaedic Surgery | Admitting: Orthopaedic Surgery

## 2013-04-26 DIAGNOSIS — Z01818 Encounter for other preprocedural examination: Secondary | ICD-10-CM | POA: Insufficient documentation

## 2013-04-26 DIAGNOSIS — Z01812 Encounter for preprocedural laboratory examination: Secondary | ICD-10-CM | POA: Insufficient documentation

## 2013-04-26 DIAGNOSIS — R9431 Abnormal electrocardiogram [ECG] [EKG]: Secondary | ICD-10-CM | POA: Insufficient documentation

## 2013-04-26 DIAGNOSIS — Z01810 Encounter for preprocedural cardiovascular examination: Secondary | ICD-10-CM | POA: Insufficient documentation

## 2013-04-26 HISTORY — DX: Nausea with vomiting, unspecified: R11.2

## 2013-04-26 HISTORY — DX: Other specified postprocedural states: Z98.890

## 2013-04-26 LAB — COMPREHENSIVE METABOLIC PANEL
ALT: 13 U/L (ref 0–35)
Albumin: 3.6 g/dL (ref 3.5–5.2)
Alkaline Phosphatase: 75 U/L (ref 39–117)
BUN: 7 mg/dL (ref 6–23)
Chloride: 104 mEq/L (ref 96–112)
GFR calc Af Amer: >90 mL/min (ref >90)
Glucose, Bld: 98 mg/dL (ref 70–99)
Potassium: 3.8 mEq/L (ref 3.5–5.1)
Sodium: 139 mEq/L (ref 135–145)
Total Bilirubin: 0.3 mg/dL (ref 0.3–1.2)
Total Protein: 7 g/dL (ref 6.0–8.3)

## 2013-04-26 LAB — URINALYSIS, ROUTINE W REFLEX MICROSCOPIC
Bilirubin Urine: NEGATIVE
Glucose, UA: NEGATIVE mg/dL
Specific Gravity, Urine: 1.029 (ref 1.005–1.030)

## 2013-04-26 LAB — SURGICAL PCR SCREEN
MRSA, PCR: NEGATIVE
Staphylococcus aureus: NEGATIVE

## 2013-04-26 LAB — CBC
HCT: 42.7 % (ref 36.0–46.0)
Hemoglobin: 15 g/dL (ref 12.0–15.0)
MCHC: 35.1 g/dL (ref 30.0–36.0)
WBC: 10.7 10*3/uL — ABNORMAL HIGH (ref 4.0–10.5)

## 2013-04-26 LAB — URINE MICROSCOPIC-ADD ON

## 2013-04-26 LAB — HCG, SERUM, QUALITATIVE: Preg, Serum: NEGATIVE

## 2013-04-26 NOTE — Pre-Procedure Instructions (Signed)
Shawna Kennedy  04/26/2013   Your procedure is scheduled on:  04-30-2013  Monday   Report to [facility] at 10:30 AM.  Call this number if you have problems the morning of surgery: [phone]   Remember:   Do not eat food or drink liquids after midnight.    Take these medicines the morning of surgery with A SIP OF WATER: albuterol inhaler if needed(Bring with you day of surgery),pain medication as needed   Do not wear jewelry, make-up or nail polish.  Do not wear lotions, powders, or perfumes.   Do not shave 48 hours prior to surgery.   Do not bring valuables to the hospital.  Contacts, dentures or bridgework may not be worn into surgery.   Leave suitcase in the car. After surgery it may be brought to your room.   For patients admitted to the hospital, checkout time is 11:00 AM the day of discharge.   Patients discharged the day of surgery will not be allowed to drive home.   Name and phone number of your driver:    Special Instructions: Shower using CHG 2 nights before surgery and the night before surgery.  If you shower the day of surgery use CHG.  Use special wash - you have one bottle of CHG for all showers.  You should use approximately 1/3 of the bottle for each shower.   Please read over the following fact sheets that you were given: Pain Booklet and Surgical Site Infection Prevention

## 2013-04-28 LAB — URINE CULTURE

## 2013-04-29 MED ORDER — VANCOMYCIN HCL IN DEXTROSE 1-5 GM/200ML-% IV SOLN
1000.0000 mg | INTRAVENOUS | Status: AC
  Administered 2013-04-30: 1000 mg via INTRAVENOUS
  Filled 2013-04-29: qty 200

## 2013-04-30 ENCOUNTER — Observation Stay (HOSPITAL_COMMUNITY)
Admission: RE | Admit: 2013-04-30 | Discharge: 2013-05-01 | Disposition: A | Discharge status: Home / Self Care | Payer: 59 | Source: Ambulatory Visit | Attending: Orthopaedic Surgery | Admitting: Orthopaedic Surgery

## 2013-04-30 ENCOUNTER — Encounter (HOSPITAL_COMMUNITY): Payer: Self-pay | Admitting: Anesthesiology

## 2013-04-30 ENCOUNTER — Ambulatory Visit (HOSPITAL_COMMUNITY): Payer: 59

## 2013-04-30 ENCOUNTER — Ambulatory Visit (HOSPITAL_COMMUNITY): Payer: 59 | Admitting: Anesthesiology

## 2013-04-30 ENCOUNTER — Encounter (HOSPITAL_COMMUNITY): Payer: Self-pay | Admitting: *Deleted

## 2013-04-30 ENCOUNTER — Encounter (HOSPITAL_COMMUNITY)
Admission: RE | Disposition: A | Discharge status: Home / Self Care | Payer: Self-pay | Source: Ambulatory Visit | Attending: Orthopaedic Surgery

## 2013-04-30 DIAGNOSIS — A498 Other bacterial infections of unspecified site: Secondary | ICD-10-CM | POA: Insufficient documentation

## 2013-04-30 DIAGNOSIS — N390 Urinary tract infection, site not specified: Secondary | ICD-10-CM | POA: Insufficient documentation

## 2013-04-30 DIAGNOSIS — M5127 Other intervertebral disc displacement, lumbosacral region: Secondary | ICD-10-CM

## 2013-04-30 DIAGNOSIS — M5126 Other intervertebral disc displacement, lumbar region: Principal | ICD-10-CM | POA: Insufficient documentation

## 2013-04-30 HISTORY — PX: LUMBAR LAMINECTOMY: SHX95

## 2013-04-30 SURGERY — MICRODISCECTOMY LUMBAR LAMINECTOMY
Anesthesia: General | Site: Back | Laterality: Left | Wound class: Clean

## 2013-04-30 MED ORDER — CEFAZOLIN SODIUM-DEXTROSE 2-3 GM-% IV SOLR
INTRAVENOUS | Status: DC | PRN
  Administered 2013-04-30: 2 g via INTRAVENOUS

## 2013-04-30 MED ORDER — HYDROMORPHONE HCL PF 1 MG/ML IJ SOLN
INTRAMUSCULAR | Status: AC
  Administered 2013-04-30: 0.5 mg via INTRAVENOUS
  Filled 2013-04-30: qty 2

## 2013-04-30 MED ORDER — LACTATED RINGERS IV SOLN: INTRAVENOUS | Status: DC

## 2013-04-30 MED ORDER — KETOROLAC TROMETHAMINE 30 MG/ML IJ SOLN
30.0000 mg | Freq: Three times a day (TID) | INTRAMUSCULAR | Status: DC
  Administered 2013-04-30 – 2013-05-01 (×2): 30 mg via INTRAVENOUS
  Filled 2013-04-30 (×5): qty 1

## 2013-04-30 MED ORDER — METHOCARBAMOL 100 MG/ML IJ SOLN
500.0000 mg | Freq: Four times a day (QID) | INTRAVENOUS | Status: DC | PRN
  Filled 2013-04-30: qty 5

## 2013-04-30 MED ORDER — NEOSTIGMINE METHYLSULFATE 1 MG/ML IJ SOLN
INTRAMUSCULAR | Status: DC | PRN
  Administered 2013-04-30: 4 mg via INTRAVENOUS

## 2013-04-30 MED ORDER — OXYCODONE-ACETAMINOPHEN 5-325 MG PO TABS
1.0000 | ORAL_TABLET | ORAL | Status: DC | PRN
Start: 2013-04-30 — End: 2013-05-01
  Administered 2013-05-01 (×3): 2 via ORAL
  Filled 2013-04-30 (×3): qty 2

## 2013-04-30 MED ORDER — ARTIFICIAL TEARS OP OINT
TOPICAL_OINTMENT | OPHTHALMIC | Status: DC | PRN
  Administered 2013-04-30: 1 via OPHTHALMIC

## 2013-04-30 MED ORDER — DOCUSATE SODIUM 100 MG PO CAPS
100.0000 mg | ORAL_CAPSULE | Freq: Two times a day (BID) | ORAL | Status: DC
  Filled 2013-04-30: qty 1

## 2013-04-30 MED ORDER — ONDANSETRON HCL 4 MG PO TABS: 4.0000 mg | ORAL_TABLET | Freq: Four times a day (QID) | ORAL | Status: DC | PRN

## 2013-04-30 MED ORDER — FENTANYL CITRATE 0.05 MG/ML IJ SOLN
INTRAMUSCULAR | Status: DC | PRN
  Administered 2013-04-30: 100 ug via INTRAVENOUS
  Administered 2013-04-30: 50 ug via INTRAVENOUS
  Administered 2013-04-30 (×2): 100 ug via INTRAVENOUS
  Administered 2013-04-30: 50 ug via INTRAVENOUS
  Administered 2013-04-30: 100 ug via INTRAVENOUS

## 2013-04-30 MED ORDER — LIDOCAINE HCL (CARDIAC) 20 MG/ML IV SOLN
INTRAVENOUS | Status: DC | PRN
  Administered 2013-04-30: 100 mg via INTRAVENOUS

## 2013-04-30 MED ORDER — SODIUM CHLORIDE 0.9 % IV SOLN
INTRAVENOUS | Status: DC
  Administered 2013-04-30: 16:00:00 via INTRAVENOUS

## 2013-04-30 MED ORDER — CEPHALEXIN 500 MG PO CAPS
500.0000 mg | ORAL_CAPSULE | Freq: Three times a day (TID) | ORAL | Status: DC
  Administered 2013-04-30 – 2013-05-01 (×2): 500 mg via ORAL
  Filled 2013-04-30 (×5): qty 1

## 2013-04-30 MED ORDER — SUMATRIPTAN SUCCINATE 50 MG PO TABS
50.0000 mg | ORAL_TABLET | ORAL | Status: DC | PRN
  Filled 2013-04-30: qty 1

## 2013-04-30 MED ORDER — GLYCOPYRROLATE 0.2 MG/ML IJ SOLN
INTRAMUSCULAR | Status: DC | PRN
  Administered 2013-04-30: .6 mg via INTRAVENOUS

## 2013-04-30 MED ORDER — ROCURONIUM BROMIDE 100 MG/10ML IV SOLN
INTRAVENOUS | Status: DC | PRN
  Administered 2013-04-30: 50 mg via INTRAVENOUS
  Administered 2013-04-30: 20 mg via INTRAVENOUS

## 2013-04-30 MED ORDER — ONDANSETRON HCL 4 MG/2ML IJ SOLN: 4.0000 mg | Freq: Once | INTRAMUSCULAR | Status: AC | PRN

## 2013-04-30 MED ORDER — HYDROMORPHONE HCL PF 1 MG/ML IJ SOLN
0.2500 mg | INTRAMUSCULAR | Status: DC | PRN
  Administered 2013-04-30 (×3): 0.5 mg via INTRAVENOUS

## 2013-04-30 MED ORDER — ONDANSETRON HCL 4 MG/2ML IJ SOLN: 4.0000 mg | Freq: Four times a day (QID) | INTRAMUSCULAR | Status: DC | PRN

## 2013-04-30 MED ORDER — ZOLPIDEM TARTRATE 5 MG PO TABS: 5.0000 mg | ORAL_TABLET | Freq: Every evening | ORAL | Status: DC | PRN

## 2013-04-30 MED ORDER — ONDANSETRON HCL 4 MG/2ML IJ SOLN
INTRAMUSCULAR | Status: AC
  Administered 2013-04-30: 4 mg via INTRAVENOUS
  Filled 2013-04-30: qty 2

## 2013-04-30 MED ORDER — METOCLOPRAMIDE HCL 5 MG PO TABS
5.0000 mg | ORAL_TABLET | Freq: Three times a day (TID) | ORAL | Status: DC | PRN
  Filled 2013-04-30: qty 2

## 2013-04-30 MED ORDER — 0.9 % SODIUM CHLORIDE (POUR BTL) OPTIME
TOPICAL | Status: DC | PRN
  Administered 2013-04-30: 1000 mL

## 2013-04-30 MED ORDER — CEFAZOLIN SODIUM 1-5 GM-% IV SOLN
INTRAVENOUS | Status: AC
  Filled 2013-04-30: qty 100

## 2013-04-30 MED ORDER — LACTATED RINGERS IV SOLN
INTRAVENOUS | Status: DC | PRN
  Administered 2013-04-30 (×3): via INTRAVENOUS

## 2013-04-30 MED ORDER — HYDROMORPHONE HCL PF 1 MG/ML IJ SOLN
0.5000 mg | INTRAMUSCULAR | Status: DC | PRN
  Administered 2013-04-30 – 2013-05-01 (×2): 1 mg via INTRAVENOUS
  Filled 2013-04-30 (×2): qty 1

## 2013-04-30 MED ORDER — HYDROCODONE-ACETAMINOPHEN 5-325 MG PO TABS: 1.0000 | ORAL_TABLET | Freq: Four times a day (QID) | ORAL | Status: DC | PRN

## 2013-04-30 MED ORDER — MEPERIDINE HCL 25 MG/ML IJ SOLN: 6.2500 mg | INTRAMUSCULAR | Status: DC | PRN

## 2013-04-30 MED ORDER — OXYCODONE HCL 5 MG/5ML PO SOLN: 5.0000 mg | Freq: Once | ORAL | Status: DC | PRN

## 2013-04-30 MED ORDER — PROPOFOL 10 MG/ML IV BOLUS
INTRAVENOUS | Status: DC | PRN
  Administered 2013-04-30: 200 mg via INTRAVENOUS
  Administered 2013-04-30: 30 mg via INTRAVENOUS
  Administered 2013-04-30: 100 mg via INTRAVENOUS

## 2013-04-30 MED ORDER — OXYCODONE HCL 5 MG PO TABS: 5.0000 mg | ORAL_TABLET | Freq: Once | ORAL | Status: DC | PRN

## 2013-04-30 MED ORDER — BUPIVACAINE HCL (PF) 0.25 % IJ SOLN
INTRAMUSCULAR | Status: AC
  Filled 2013-04-30: qty 30

## 2013-04-30 MED ORDER — DIPHENHYDRAMINE HCL 25 MG PO CAPS
25.0000 mg | ORAL_CAPSULE | Freq: Four times a day (QID) | ORAL | Status: DC | PRN
  Administered 2013-04-30 – 2013-05-01 (×2): 25 mg via ORAL
  Filled 2013-04-30 (×2): qty 1

## 2013-04-30 MED ORDER — ALBUTEROL SULFATE HFA 108 (90 BASE) MCG/ACT IN AERS
2.0000 | INHALATION_SPRAY | RESPIRATORY_TRACT | Status: DC | PRN
  Filled 2013-04-30: qty 6.7

## 2013-04-30 MED ORDER — METHOCARBAMOL 500 MG PO TABS
500.0000 mg | ORAL_TABLET | Freq: Four times a day (QID) | ORAL | Status: DC | PRN
  Administered 2013-04-30: 500 mg via ORAL
  Filled 2013-04-30: qty 1

## 2013-04-30 MED ORDER — PNEUMOCOCCAL VAC POLYVALENT 25 MCG/0.5ML IJ INJ
0.5000 mL | INJECTION | INTRAMUSCULAR | Status: AC
  Administered 2013-05-01: 0.5 mL via INTRAMUSCULAR
  Filled 2013-04-30: qty 0.5

## 2013-04-30 MED ORDER — KETOROLAC TROMETHAMINE 30 MG/ML IJ SOLN
INTRAMUSCULAR | Status: AC
  Administered 2013-04-30: 30 mg via INTRAVENOUS
  Filled 2013-04-30: qty 1

## 2013-04-30 MED ORDER — ONDANSETRON HCL 4 MG/2ML IJ SOLN
INTRAMUSCULAR | Status: DC | PRN
  Administered 2013-04-30 (×2): 4 mg via INTRAVENOUS

## 2013-04-30 MED ORDER — MIDAZOLAM HCL 5 MG/5ML IJ SOLN
INTRAMUSCULAR | Status: DC | PRN
  Administered 2013-04-30 (×2): 2 mg via INTRAVENOUS

## 2013-04-30 MED ORDER — METOCLOPRAMIDE HCL 5 MG/ML IJ SOLN
5.0000 mg | Freq: Three times a day (TID) | INTRAMUSCULAR | Status: DC | PRN
  Filled 2013-04-30: qty 2

## 2013-04-30 SURGICAL SUPPLY — 51 items
BLADE SURG 15 STRL LF DISP TIS (BLADE) ×1 IMPLANT
BLADE SURG 15 STRL SS (BLADE) ×1
BUR ROUND FLUTED 4 SOFT TCH (BURR) ×2 IMPLANT
CLOTH BEACON ORANGE TIMEOUT ST (SAFETY) ×2 IMPLANT
CORDS BIPOLAR (ELECTRODE) ×2 IMPLANT
COVER MAYO STAND STRL (DRAPES) ×6 IMPLANT
COVER SURGICAL LIGHT HANDLE (MISCELLANEOUS) ×2 IMPLANT
DERMABOND ADVANCED (GAUZE/BANDAGES/DRESSINGS) ×1
DERMABOND ADVANCED .7 DNX12 (GAUZE/BANDAGES/DRESSINGS) ×1 IMPLANT
DRAPE MICROSCOPE LEICA (MISCELLANEOUS) ×2 IMPLANT
DRAPE PROXIMA HALF (DRAPES) ×4 IMPLANT
DRSG EMULSION OIL 3X3 NADH (GAUZE/BANDAGES/DRESSINGS) ×2 IMPLANT
DRSG MEPILEX BORDER 4X4 (GAUZE/BANDAGES/DRESSINGS) ×2 IMPLANT
DRSG MEPILEX BORDER 4X8 (GAUZE/BANDAGES/DRESSINGS) ×2 IMPLANT
DURAPREP 26ML APPLICATOR (WOUND CARE) ×2 IMPLANT
ELECT BLADE 4.0 EZ CLEAN MEGAD (MISCELLANEOUS) ×2
ELECT REM PT RETURN 9FT ADLT (ELECTROSURGICAL) ×2
ELECTRODE BLDE 4.0 EZ CLN MEGD (MISCELLANEOUS) ×1 IMPLANT
ELECTRODE REM PT RTRN 9FT ADLT (ELECTROSURGICAL) ×1 IMPLANT
GLOVE BIOGEL PI IND STRL 7.5 (GLOVE) ×1 IMPLANT
GLOVE BIOGEL PI IND STRL 8 (GLOVE) ×1 IMPLANT
GLOVE BIOGEL PI INDICATOR 7.5 (GLOVE) ×1
GLOVE BIOGEL PI INDICATOR 8 (GLOVE) ×1
GLOVE ECLIPSE 7.0 STRL STRAW (GLOVE) ×2 IMPLANT
GLOVE ORTHO TXT STRL SZ7.5 (GLOVE) ×2 IMPLANT
GOWN PREVENTION PLUS LG XLONG (DISPOSABLE) ×4 IMPLANT
GOWN STRL NON-REIN LRG LVL3 (GOWN DISPOSABLE) ×2 IMPLANT
KIT BASIN OR (CUSTOM PROCEDURE TRAY) ×2 IMPLANT
KIT ROOM TURNOVER OR (KITS) ×2 IMPLANT
MANIFOLD NEPTUNE II (INSTRUMENTS) ×2 IMPLANT
NDL SUT .5 MAYO 1.404X.05X (NEEDLE) ×1 IMPLANT
NEEDLE HYPO 25GX1X1/2 BEV (NEEDLE) ×2 IMPLANT
NEEDLE MAYO TAPER (NEEDLE) ×1
NEEDLE SPNL 18GX3.5 QUINCKE PK (NEEDLE) ×2 IMPLANT
NS IRRIG 1000ML POUR BTL (IV SOLUTION) ×2 IMPLANT
PACK LAMINECTOMY ORTHO (CUSTOM PROCEDURE TRAY) ×2 IMPLANT
PAD ARMBOARD 7.5X6 YLW CONV (MISCELLANEOUS) ×4 IMPLANT
PATTIES SURGICAL .5 X.5 (GAUZE/BANDAGES/DRESSINGS) ×4 IMPLANT
PATTIES SURGICAL .75X.75 (GAUZE/BANDAGES/DRESSINGS) IMPLANT
SPONGE GAUZE 4X4 12PLY (GAUZE/BANDAGES/DRESSINGS) ×2 IMPLANT
SUT VIC AB 2-0 CT1 27 (SUTURE) ×1
SUT VIC AB 2-0 CT1 TAPERPNT 27 (SUTURE) ×1 IMPLANT
SUT VIC AB 3-0 FS2 27 (SUTURE) ×2 IMPLANT
SUT VICRYL 0 TIES 12 18 (SUTURE) ×2 IMPLANT
SUT VICRYL 4-0 PS2 18IN ABS (SUTURE) IMPLANT
SUT VICRYL AB 2 0 TIES (SUTURE) ×2 IMPLANT
SYR 20ML ECCENTRIC (SYRINGE) IMPLANT
SYR CONTROL 10ML LL (SYRINGE) ×2 IMPLANT
TOWEL OR 17X24 6PK STRL BLUE (TOWEL DISPOSABLE) ×2 IMPLANT
TOWEL OR 17X26 10 PK STRL BLUE (TOWEL DISPOSABLE) ×2 IMPLANT
WATER STERILE IRR 1000ML POUR (IV SOLUTION) ×2 IMPLANT

## 2013-04-30 NOTE — Interval H&P Note (Signed)
History and Physical Interval Note:  04/30/2013 12:37 PM  Shawna Kennedy  has presented today for surgery, with the diagnosis of Left L5-S1 Microdiscectomy  The various methods of treatment have been discussed with the patient and family. After consideration of risks, benefits and other options for treatment, the patient has consented to  Procedure(s) with comments: MICRODISCECTOMY LUMBAR LAMINECTOMY (Left) - Left L5-S1 Microdiscectomy  as a surgical intervention .  The patient's history has been reviewed, patient examined, no change in status, stable for surgery.  I have reviewed the patient's chart and labs.  Questions were answered to the patient's satisfaction.  Has severe lateral recess stenosis and E coli UTI. Proceed with surgery and treat UTI post op. Will need more than 48hr ABX for UTI Tx.    Jarvis Sawa C

## 2013-04-30 NOTE — Transfer of Care (Signed)
Immediate Anesthesia Transfer of Care Note  Patient: Shawna Kennedy  Procedure(s) Performed: Procedure(s) with comments: MICRODISCECTOMY LUMBAR LAMINECTOMY (Left) - Left L5-S1 Microdiscectomy   Patient Location: PACU  Anesthesia Type:General  Level of Consciousness: awake, alert  and oriented  Airway & Oxygen Therapy: Patient Spontanous Breathing and Patient connected to nasal cannula oxygen  Post-op Assessment: Report given to PACU RN and Post -op Vital signs reviewed and stable  Post vital signs: Reviewed and stable  Complications: No apparent anesthesia complications

## 2013-04-30 NOTE — Op Note (Signed)
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  Surgeon: Annell Greening M.D.  Asst.: Patrick Jupiter RNFA  EBL less than 100 cc  Anesthesia: Gen.  Brief history: This 32 year old female set at the six-month history of back and left leg symptoms a three-month history of progressive leg weakness with MRI scan showing large HNP with lateral recess compression consistent with radiculopathy sensory deficit no motor deficit with calf weakness.  Procedure: After positioning in the prone position after intubation vancomycin the stripping due to the patient's history of the Escherichia coli UTI her Ancef was given in addition for treatment ETI. Patient was PCR negative and resume vancomycin was started due to her multiple allergies. Ancef was given IV for treatment of the Escherichia coli UTI with her history of allergic reaction to Cipro, resistance of the: Item Bactrim another option the other optionsthat were sensitive were  gentamicin and tobramycin.  Back was prepped with DuraPrep due the patient's large size plain radiographs felt to be ineffective for localization. C-arm was brought in from was localization with patient'Lula-arm showed the spinal needle was buried and still cannot touch the fascia. It was localized to the clinic level LI. Area square and had been squared with towels Betadine Steri-Drape applied and laminectomy sheet and drapes.  Midline incision was made longer than usual due to the patient's habitus and the self-retaining tractor with extra deep blades were placed and  Also cerebellar retractors were placed vertically pointed toward the floor in order to given applied to the bottom.-Was finally encountered.-Was opened [place] 4 was placed down in segment x-ray was taken and shows the Big Arm 4 was just inferior to the inferior aspect of L5  lamina appropriate we'll place for 51 exposure. Upper microscopist draped brought in and ligament was removed laminotomy was performed at L5 and S1 lateral recess decompression was performed removing the Giemsa ligament. Neuro is extremely tight and the extra long instruments were used. 5 pound weight was placed with the deepest evidently had in the hospital system. With the dura retracted toward the midline by [facility] number 4:15 blade on extra long knife handle was used to incise annulus and pieces of disc removed. A large fragment was able to be teased out that had been extruded caudally in this was 2 x 1 x 3 for self centimeter. After this point the dura tract are more easily toward the midline multiple passes were made. Foraminal was enlarged. Nerve root was free there is no lateral compression and up and down extra long might extra long pituitaries were used for decompression of the disc until extra long hockey-stick could be passed anterior to the dura 180 with No areas of compression. After initial saline solution operative field was dry. There is no bleeding epidural veins. Passes reapproximated with number one 10-0 interrupted Vicryl 2-0 Vicryl to reproduce the states 10 inch adipose layer and then 4-0 Vicryl subcuticular closure. Dermabond was applied to the skin postop dressing and transferred recovery periods and count needle count was correct.patient's transferred to her room in stable condition.
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
## 2013-04-30 NOTE — Preoperative (Signed)
Beta Blockers   Reason not to administer Beta Blockers:Not Applicable 

## 2013-04-30 NOTE — Anesthesia Postprocedure Evaluation (Signed)
  Anesthesia Post-op Note  Patient: Shawna Kennedy  Procedure(s) Performed: Procedure(s) with comments: MICRODISCECTOMY LUMBAR LAMINECTOMY (Left) - Left L5-S1 Microdiscectomy   Patient Location: PACU  Anesthesia Type:General  Level of Consciousness: awake, alert  and oriented  Airway and Oxygen Therapy: Patient Spontanous Breathing and Patient connected to nasal cannula oxygen  Post-op Pain: mild  Post-op Assessment: Post-op Vital signs reviewed, Patient's Cardiovascular Status Stable, Respiratory Function Stable, Patent Airway and Pain level controlled  Post-op Vital Signs: stable  Complications: No apparent anesthesia complications

## 2013-04-30 NOTE — Plan of Care (Signed)
Problem: Consults Goal: Diagnosis - Spinal Surgery Outcome: Completed/Met Date Met:  04/30/13 Microdiscectomy

## 2013-04-30 NOTE — Anesthesia Preprocedure Evaluation (Signed)
Anesthesia Evaluation  Patient identified by MRN, date of birth, ID band Patient awake    Reviewed: Allergy & Precautions, H&P , NPO status , Patient's Chart, lab work & pertinent test results  History of Anesthesia Complications (+) PONV  Airway Mallampati: I TM Distance: >3 FB Neck ROM: Full    Dental   Pulmonary          Cardiovascular     Neuro/Psych  Headaches,    GI/Hepatic   Endo/Other    Renal/GU      Musculoskeletal   Abdominal   Peds  Hematology   Anesthesia Other Findings   Reproductive/Obstetrics                           Anesthesia Physical Anesthesia Plan  ASA: II  Anesthesia Plan: General   Post-op Pain Management:    Induction: Intravenous  Airway Management Planned: Oral ETT  Additional Equipment:   Intra-op Plan:   Post-operative Plan: Extubation in OR  Informed Consent: I have reviewed the patients History and Physical, chart, labs and discussed the procedure including the risks, benefits and alternatives for the proposed anesthesia with the patient or authorized representative who has indicated his/her understanding and acceptance.     Plan Discussed with: CRNA and Surgeon  Anesthesia Plan Comments:         Anesthesia Quick Evaluation

## 2013-04-30 NOTE — Brief Op Note (Signed)
04/30/2013  3:38 PM  PATIENT:  Shawna Kennedy  32 y.o. female  PRE-OPERATIVE DIAGNOSIS:  Left L5-S1 Microdiscectomy  POST-OPERATIVE DIAGNOSIS:  Left L5-S1 Microdiscectomy  PROCEDURE:  Procedure(s) with comments: MICRODISCECTOMY LUMBAR LAMINECTOMY (Left) - Left L5-S1 Microdiscectomy   SURGEON:  Surgeon(s) and Role:    * Eldred Manges, MD - Primary  PHYSICIAN ASSISTANT:   ASSISTANTS: Patrick Jupiter  RNFA   ANESTHESIA:   general  EBL:  Total I/O In: 2000 [I.V.:2000] Out: -   BLOOD ADMINISTERED:none  DRAINS: none   LOCAL MEDICATIONS USED:  NONE  SPECIMEN:  No Specimen  DISPOSITION OF SPECIMEN:  N/A  COUNTS:  YES  TOURNIQUET:  * No tourniquets in log *  DICTATION: .Dragon Dictation  PLAN OF CARE: Admit to inpatient   PATIENT DISPOSITION:  PACU - hemodynamically stable.   Delay start of Pharmacological VTE agent (>24hrs) due to surgical blood loss or risk of bleeding: not applicable  SCD and ASA

## 2013-05-01 ENCOUNTER — Encounter (HOSPITAL_COMMUNITY): Payer: Self-pay | Admitting: Orthopaedic Surgery

## 2013-05-01 MED ORDER — OXYCODONE-ACETAMINOPHEN 5-325 MG PO TABS: 2.0000 | ORAL_TABLET | ORAL | Status: DC | PRN

## 2013-05-01 MED ORDER — METHOCARBAMOL 500 MG PO TABS: 500.0000 mg | ORAL_TABLET | Freq: Three times a day (TID) | ORAL | Status: DC

## 2013-05-01 MED ORDER — CEPHALEXIN 500 MG PO CAPS: 500.0000 mg | ORAL_CAPSULE | Freq: Three times a day (TID) | ORAL | Status: DC

## 2013-05-01 MED ORDER — FLUCONAZOLE 100 MG PO TABS
100.0000 mg | ORAL_TABLET | Freq: Every day | ORAL | Status: DC
  Administered 2013-05-01: 100 mg via ORAL
  Filled 2013-05-01: qty 1

## 2013-05-01 MED ORDER — FLUCONAZOLE 100 MG PO TABS: 100.0000 mg | ORAL_TABLET | Freq: Every day | ORAL | Status: DC

## 2013-05-01 NOTE — Discharge Summary (Signed)
Physician Discharge Summary  Patient ID: Shawna Kennedy MRN: [id number] DOB/AGE: 04/10/1981 32 y.o.  Admit date: 04/30/2013 Discharge date: 05/01/2013  Admission Diagnoses:left L5-S1 HNP with radiculopathy  Discharge Diagnoses: same Principal Problem:   Herniated nucleus pulposus, L5-S1, left Active Problems:   Infection of urinary tract   Discharged Condition: good  Hospital Course: underwent surgery left L5-S1 microdiscectomy  with good relief of pre-op pain and weakness.   Consults: None  Significant Diagnostic Studies:   Treatments: surgery: as above   Discharge Exam: Blood pressure 119/75, pulse 87, temperature 97.8 F (36.6 C), temperature source Oral, resp. rate 18, last menstrual period 04/29/2013, SpO2 100.00%. AT [facility], GC right and left strong, ambulatory to BR. mild residual numbness ( as expected.)   incision clean and dry, no drainage.   Disposition: 01-Home or Self Care     Medication List    STOP taking these medications       HYDROcodone-acetaminophen 5-325 MG per tablet  Commonly known as:  NORCO/VICODIN      TAKE these medications       albuterol 108 (90 BASE) MCG/ACT inhaler  Commonly known as:  PROVENTIL HFA;VENTOLIN HFA  Inhale 2 puffs into the lungs as needed for shortness of breath.     cephALEXin 500 MG capsule  Commonly known as:  KEFLEX  Take 1 capsule (500 mg total) by mouth every 8 (eight) hours.     fluconazole 100 MG tablet  Commonly known as:  DIFLUCAN  Take 1 tablet (100 mg total) by mouth daily.     methocarbamol 500 MG tablet  Commonly known as:  ROBAXIN  Take 1 tablet (500 mg total) by mouth 3 (three) times daily.     oxyCODONE-acetaminophen 5-325 MG per tablet  Commonly known as:  PERCOCET/ROXICET  Take 2 tablets by mouth every 4 (four) hours as needed.     SUMAtriptan 50 MG tablet  Commonly known as:  IMITREX  Take 50 mg by mouth every 2 (two) hours as needed for migraine.     zolpidem 12.5 MG CR tablet  Commonly  known as:  AMBIEN CR  Take 12.5 mg by mouth at bedtime as needed for sleep.           Follow-up Information   Follow up with Reyhan Moronta C, MD In 1 week.   Contact information:   [address] [place] [address] [phone]       Signed: Eldred Manges 05/01/2013, 8:08 AM

## 2014-04-27 IMAGING — CR DG LUMBAR SPINE COMPLETE 4+V
5 series · 5 of 5 positions shown · non-contrast
Comparison: None.

CLINICAL DATA: Low back pain and pain in both hips, no injury

LUMBAR SPINE - COMPLETE 4+ VIEW

[t l-spine a.p. *]
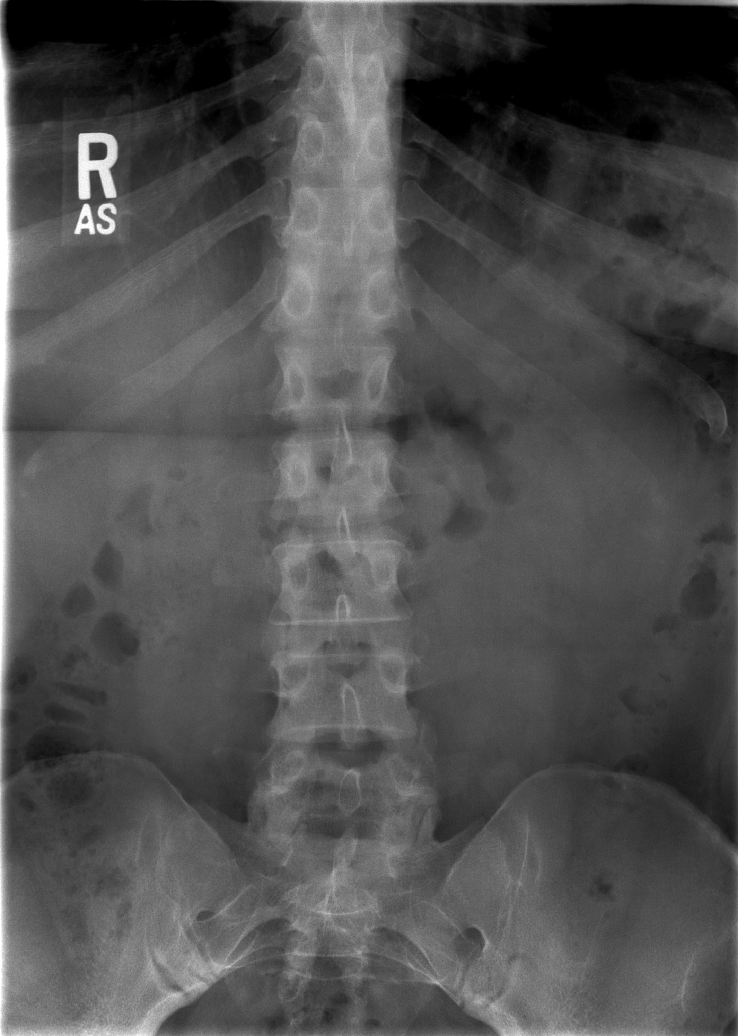

[t l-spine oblique exposure * (1 of 2)]
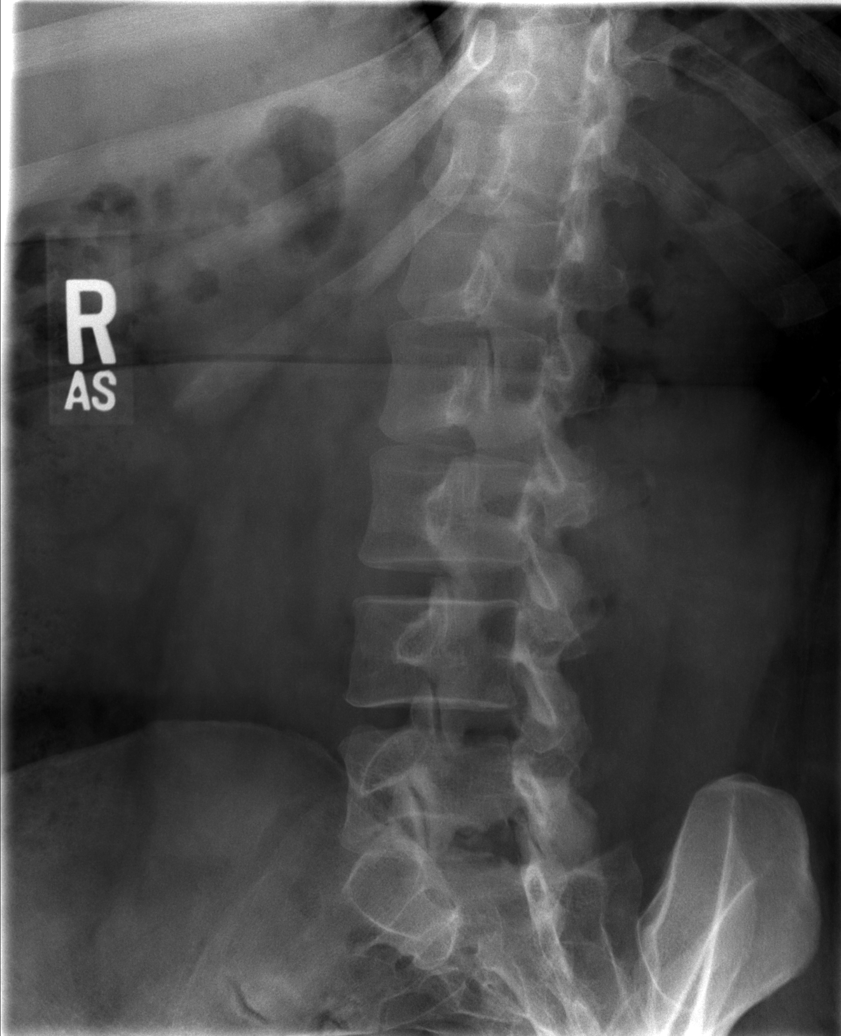

[t l-spine oblique exposure * (2 of 2)]
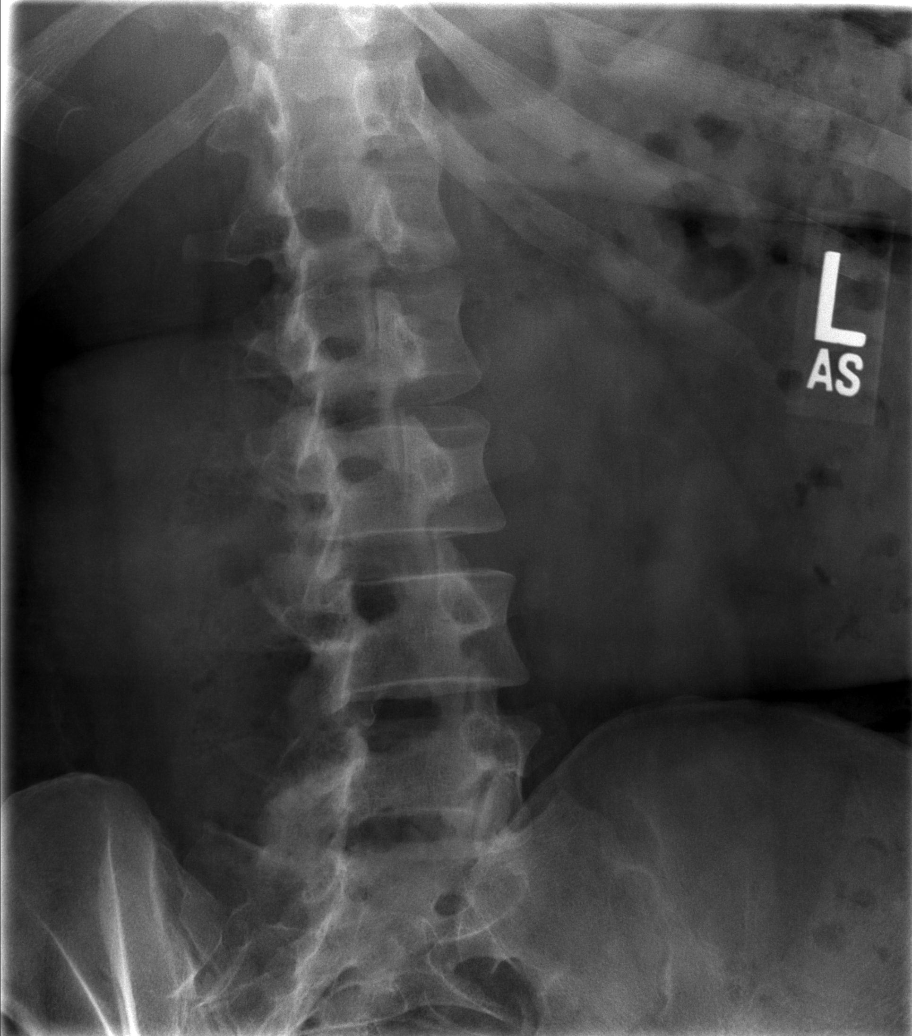

[t l-spine lat *]
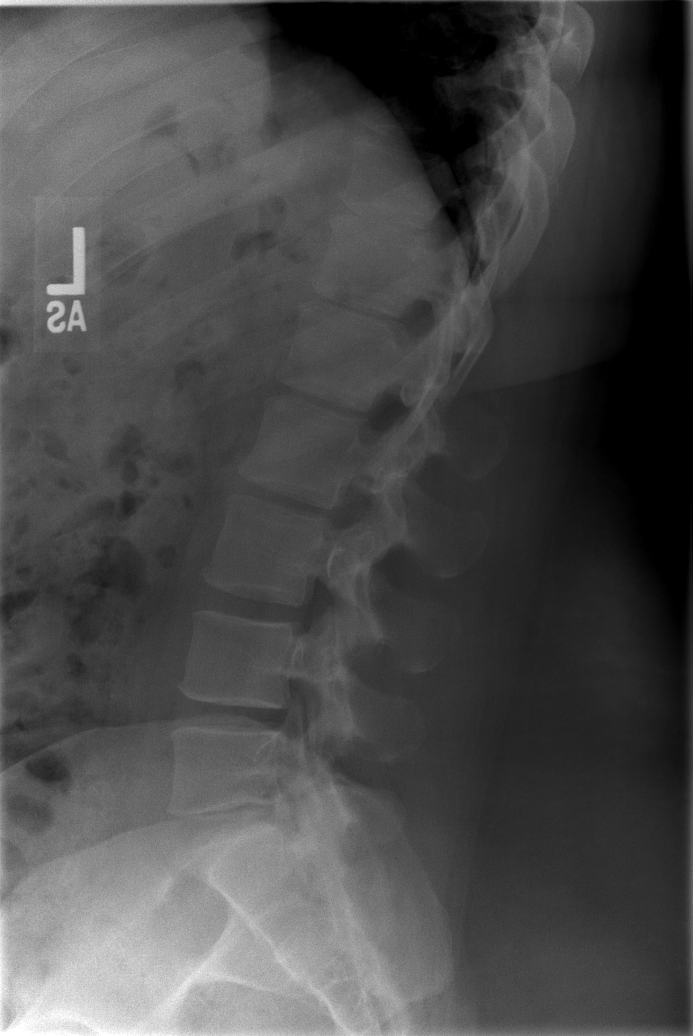

[t l-spine l5-s1 spot]
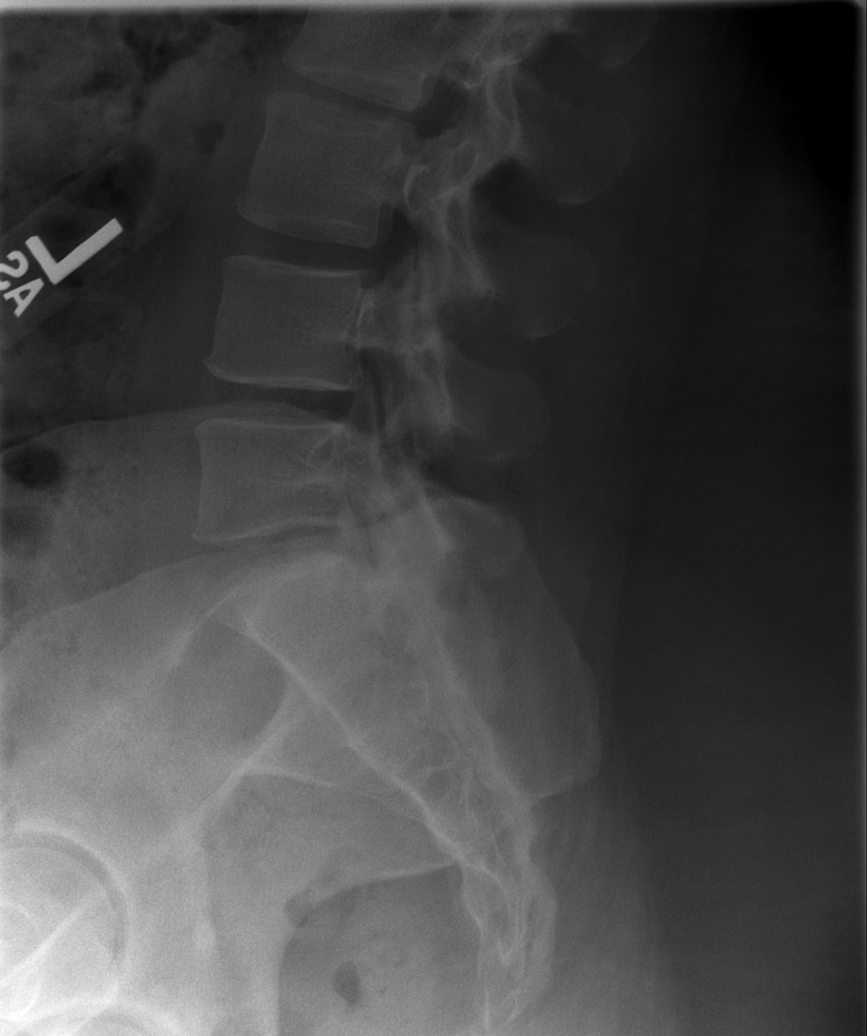

[5 of 5 positions shown; findings below may reference images not displayed]

FINDINGS: The lumbar vertebrae are in normal alignment.
Intervertebral disc spaces appear normal.  No compression deformity
is seen.  The SI joints are corticated.
IMPRESSION: Normal alignment.  Normal disc spaces.

## 2014-06-24 ENCOUNTER — Emergency Department (HOSPITAL_COMMUNITY): Payer: 59

## 2014-06-24 ENCOUNTER — Emergency Department (HOSPITAL_COMMUNITY): Payer: Self-pay

## 2014-06-24 ENCOUNTER — Encounter (HOSPITAL_COMMUNITY): Payer: Self-pay | Admitting: Emergency Medicine

## 2014-06-24 ENCOUNTER — Emergency Department (HOSPITAL_COMMUNITY)
Admission: EM | Admit: 2014-06-24 | Discharge: 2014-06-24 | Disposition: A | Discharge status: Home / Self Care | Payer: Self-pay | Attending: Emergency Medicine | Admitting: Emergency Medicine

## 2014-06-24 DIAGNOSIS — Z8639 Personal history of other endocrine, nutritional and metabolic disease: Secondary | ICD-10-CM | POA: Insufficient documentation

## 2014-06-24 DIAGNOSIS — Z862 Personal history of diseases of the blood and blood-forming organs and certain disorders involving the immune mechanism: Secondary | ICD-10-CM | POA: Insufficient documentation

## 2014-06-24 DIAGNOSIS — H9209 Otalgia, unspecified ear: Secondary | ICD-10-CM | POA: Insufficient documentation

## 2014-06-24 DIAGNOSIS — J040 Acute laryngitis: Secondary | ICD-10-CM | POA: Insufficient documentation

## 2014-06-24 DIAGNOSIS — Z880 Allergy status to penicillin: Secondary | ICD-10-CM | POA: Insufficient documentation

## 2014-06-24 DIAGNOSIS — R509 Fever, unspecified: Secondary | ICD-10-CM | POA: Insufficient documentation

## 2014-06-24 DIAGNOSIS — J029 Acute pharyngitis, unspecified: Secondary | ICD-10-CM | POA: Insufficient documentation

## 2014-06-24 DIAGNOSIS — Z8739 Personal history of other diseases of the musculoskeletal system and connective tissue: Secondary | ICD-10-CM | POA: Insufficient documentation

## 2014-06-24 DIAGNOSIS — R51 Headache: Secondary | ICD-10-CM | POA: Insufficient documentation

## 2014-06-24 DIAGNOSIS — G4700 Insomnia, unspecified: Secondary | ICD-10-CM | POA: Insufficient documentation

## 2014-06-24 DIAGNOSIS — F17200 Nicotine dependence, unspecified, uncomplicated: Secondary | ICD-10-CM | POA: Insufficient documentation

## 2014-06-24 DIAGNOSIS — J45909 Unspecified asthma, uncomplicated: Secondary | ICD-10-CM | POA: Insufficient documentation

## 2014-06-24 DIAGNOSIS — F411 Generalized anxiety disorder: Secondary | ICD-10-CM | POA: Insufficient documentation

## 2014-06-24 DIAGNOSIS — Z79899 Other long term (current) drug therapy: Secondary | ICD-10-CM | POA: Insufficient documentation

## 2014-06-24 DIAGNOSIS — Z8679 Personal history of other diseases of the circulatory system: Secondary | ICD-10-CM | POA: Insufficient documentation

## 2014-06-24 LAB — I-STAT CHEM 8, ED
BUN: 4 mg/dL — ABNORMAL LOW (ref 6–23)
CALCIUM ION: 1.13 mmol/L (ref 1.12–1.23)
CREATININE: 0.6 mg/dL (ref 0.50–1.10)
Chloride: 105 mEq/L (ref 96–112)
GLUCOSE: 102 mg/dL — AB (ref 70–99)
HCT: 46 % (ref 36.0–46.0)
HEMOGLOBIN: 15.6 g/dL — AB (ref 12.0–15.0)
Potassium: 3.9 mEq/L (ref 3.7–5.3)
Sodium: 141 mEq/L (ref 137–147)
TCO2: 21 mmol/L (ref 0–100)

## 2014-06-24 LAB — RAPID STREP SCREEN (MED CTR MEBANE ONLY): Streptococcus, Group A Screen (Direct): NEGATIVE

## 2014-06-24 MED ORDER — ONDANSETRON HCL 4 MG PO TABS: 4.0000 mg | ORAL_TABLET | Freq: Four times a day (QID) | ORAL | Status: DC

## 2014-06-24 MED ORDER — HYDROCODONE-ACETAMINOPHEN 5-325 MG PO TABS: 1.0000 | ORAL_TABLET | Freq: Four times a day (QID) | ORAL | Status: DC | PRN

## 2014-06-24 MED ORDER — IOHEXOL 300 MG/ML  SOLN
80.0000 mL | Freq: Once | INTRAMUSCULAR | Status: AC | PRN
  Administered 2014-06-24: 80 mL via INTRAVENOUS

## 2014-06-24 MED ORDER — ONDANSETRON 8 MG PO TBDP
8.0000 mg | ORAL_TABLET | Freq: Once | ORAL | Status: AC
  Administered 2014-06-24: 8 mg via ORAL
  Filled 2014-06-24: qty 1

## 2014-06-24 MED ORDER — OXYCODONE-ACETAMINOPHEN 5-325 MG PO TABS
2.0000 | ORAL_TABLET | Freq: Once | ORAL | Status: AC
  Administered 2014-06-24: 2 via ORAL
  Filled 2014-06-24: qty 2
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
## 2014-06-24 NOTE — Discharge Instructions (Signed)
Sore Throat A sore throat is a painful, burning, sore, or scratchy feeling of the throat. There may be pain or tenderness when swallowing or talking. You may have other symptoms with a sore throat. These include coughing, sneezing, fever, or a swollen neck. A sore throat is often the first sign of another sickness. These sicknesses may include a cold, flu, strep throat, or an infection called mono. Most sore throats go away without medical treatment.  HOME CARE   Only take medicine as told by your doctor.  Drink enough fluids to keep your pee (urine) clear or pale yellow.  Rest as needed.  Try using throat sprays, lozenges, or suck on hard candy (if older than 4 years or as told).  Sip warm liquids, such as broth, herbal tea, or warm water with honey. Try sucking on frozen ice pops or drinking cold liquids.  Rinse the mouth (gargle) with salt water. Mix 1 teaspoon salt with 8 ounces of water.  Do not smoke. Avoid being around others when they are smoking.  Put a humidifier in your bedroom at night to moisten the air. You can also turn on a hot shower and sit in the bathroom for 5-10 minutes. Be sure the bathroom door is closed. GET HELP RIGHT AWAY IF:   You have trouble breathing.  You cannot swallow fluids, soft foods, or your spit (saliva).  You have more puffiness (swelling) in the throat.  Your sore throat does not get better in 7 days.  You feel sick to your stomach (nauseous) and throw up (vomit).  You have a fever or lasting symptoms for more than 2-3 days.  You have a fever and your symptoms suddenly get worse. MAKE SURE YOU:   Understand these instructions.  Will watch your condition.  Will get help right away if you are not doing well or get worse. Document Released: 08/24/2008 Document Revised: 08/09/2012 Document Reviewed: 07/23/2012 [facility] Patient Information 2015 [place]. This information is not intended to replace advice given to you by your health  care provider. Make sure you discuss any questions you have with your health care provider.  Laryngitis At the top of your windpipe is your voice box. It is the source of your voice. Inside your voice box are 2 bands of muscles called vocal cords. When you breathe, your vocal cords are relaxed and open so that air can get into the lungs. When you decide to say something, these cords come together and vibrate. The sound from these vibrations goes into your throat and comes out through your mouth as sound. Laryngitis is an inflammation of the vocal cords that causes hoarseness, cough, loss of voice, sore throat, and dry throat. Laryngitis can be temporary (acute) or long-term (chronic). Most cases of acute laryngitis improve with time.Chronic laryngitis lasts for more than 3 weeks. CAUSES Laryngitis can often be related to excessive smoking, talking, or yelling, as well as inhalation of toxic fumes and allergies. Acute laryngitis is usually caused by a viral infection, vocal strain, measles or mumps, or bacterial infections. Chronic laryngitis is usually caused by vocal cord strain, vocal cord injury, postnasal drip, growths on the vocal cords, or acid reflux. SYMPTOMS   Cough.  Sore throat.  Dry throat. RISK FACTORS  Respiratory infections.  Exposure to irritating substances, such as cigarette smoke, excessive amounts of alcohol, stomach acids, and workplace chemicals.  Voice trauma, such as vocal cord injury from shouting or speaking too loud. DIAGNOSIS  Your cargiver will perform  a physical exam. During the physical exam, your caregiver will examine your throat. The most common sign of laryngitis is hoarseness. Laryngoscopy may be necessary to confirm the diagnosis of this condition. This procedure allows your caregiver to look into the larynx. HOME CARE INSTRUCTIONS  Drink enough fluids to keep your urine clear or pale yellow.  Rest until you no longer have symptoms or as directed by  your caregiver.  Breathe in moist air.  Take all medicine as directed by your caregiver.  Do not smoke.  Talk as little as possible (this includes whispering).  Write on paper instead of talking until your voice is back to normal.  Follow up with your caregiver if your condition has not improved after 10 days. SEEK MEDICAL CARE IF:   You have trouble breathing.  You cough up blood.  You have persistent fever.  You have increasing pain.  You have difficulty swallowing. MAKE SURE YOU:  Understand these instructions.  Will watch your condition.  Will get help right away if you are not doing well or get worse. Document Released: 11/15/2005 Document Revised: 02/07/2012 Document Reviewed: 01/21/2011 [facility] Patient Information 2015 [place], [place]. This information is not intended to replace advice given to you by your health care provider. Make sure you discuss any questions you have with your health care provider.

## 2014-06-24 NOTE — Progress Notes (Signed)
[facility] provided pt with a list of primary care resources and a [facility] [employer] Card application to help patient establish primary care.  °

## 2014-06-24 NOTE — Progress Notes (Signed)
  CARE MANAGEMENT ED NOTE 06/24/2014  Patient:  Elveria RoyalsDUNN,Tajuanna M   Account Number:  [id number]  Date Initiated:  06/24/2014  Documentation initiated by:  Radford PaxFERRERO,Johnathon Mittal  Subjective/Objective Assessment:   Patient presents to Ed with sore throat     Subjective/Objective Assessment Detail:     Action/Plan:   Action/Plan Detail:   Anticipated DC Date:  06/24/2014     Status Recommendation to Physician:   Result of Recommendation:    Other ED Services  Consult Working Plan    DC Planning Services  Other  PCP issues    Choice offered to / List presented to:            Status of service:  Completed, signed off  ED Comments:   ED Comments Detail:  Patient to be discharged with prescription for zofran and vicodin tablets.  Patient is without insurnace.  [facility] with [facility] printed from [email or web address]  Patient agreeable to discounted copays.  Patient thankful for assistance.  No further EDCM needs at this time.

## 2014-06-24 NOTE — ED Notes (Signed)
Pt c/o of a burning sore throat that started yesterday. She states that she has been having trouble swallowing and controlling saliva. She also is complaining of a headache and ear pain that also started yesterday.

## 2014-06-25 ENCOUNTER — Telehealth: Payer: Self-pay | Admitting: Family Medicine

## 2014-06-25 NOTE — Telephone Encounter (Signed)
ER  Letter sent 

## 2014-06-25 NOTE — ED Provider Notes (Signed)
Medical screening examination/treatment/procedure(s) were performed by non-physician practitioner and as supervising physician I was immediately available for consultation/collaboration.   EKG Interpretation None        Audree CamelScott T Maddyx Vallie, MD 06/25/14 1540

## 2014-06-26 LAB — CULTURE, GROUP A STREP

## 2014-10-18 ENCOUNTER — Emergency Department (INDEPENDENT_AMBULATORY_CARE_PROVIDER_SITE_OTHER)
Admission: EM | Admit: 2014-10-18 | Discharge: 2014-10-18 | Disposition: A | Discharge status: Home / Self Care | Payer: Self-pay | Source: Home / Self Care | Attending: Emergency Medicine | Admitting: Emergency Medicine

## 2014-10-18 ENCOUNTER — Encounter (HOSPITAL_COMMUNITY): Payer: Self-pay | Admitting: Emergency Medicine

## 2014-10-18 ENCOUNTER — Ambulatory Visit (HOSPITAL_COMMUNITY): Payer: Self-pay | Attending: Emergency Medicine

## 2014-10-18 DIAGNOSIS — R0602 Shortness of breath: Secondary | ICD-10-CM | POA: Insufficient documentation

## 2014-10-18 DIAGNOSIS — J018 Other acute sinusitis: Secondary | ICD-10-CM

## 2014-10-18 MED ORDER — IPRATROPIUM BROMIDE 0.02 % IN SOLN
0.5000 mg | Freq: Once | RESPIRATORY_TRACT | Status: AC
  Administered 2014-10-18: 0.5 mg via RESPIRATORY_TRACT

## 2014-10-18 MED ORDER — IPRATROPIUM BROMIDE 0.02 % IN SOLN
RESPIRATORY_TRACT | Status: AC
  Filled 2014-10-18: qty 2.5

## 2014-10-18 MED ORDER — PREDNISONE 20 MG PO TABS
ORAL_TABLET | ORAL | Status: AC
  Filled 2014-10-18: qty 3

## 2014-10-18 MED ORDER — ALBUTEROL SULFATE (2.5 MG/3ML) 0.083% IN NEBU
INHALATION_SOLUTION | RESPIRATORY_TRACT | Status: AC
  Filled 2014-10-18: qty 6

## 2014-10-18 MED ORDER — ALBUTEROL SULFATE (2.5 MG/3ML) 0.083% IN NEBU
5.0000 mg | INHALATION_SOLUTION | Freq: Once | RESPIRATORY_TRACT | Status: AC
  Administered 2014-10-18: 5 mg via RESPIRATORY_TRACT

## 2014-10-18 MED ORDER — MINOCYCLINE HCL 100 MG PO CAPS: 100.0000 mg | ORAL_CAPSULE | Freq: Two times a day (BID) | ORAL | Status: DC

## 2014-10-18 MED ORDER — PREDNISONE 50 MG PO TABS: 50.0000 mg | ORAL_TABLET | Freq: Every day | ORAL | Status: DC

## 2014-10-18 MED ORDER — PREDNISONE 20 MG PO TABS
60.0000 mg | ORAL_TABLET | Freq: Once | ORAL | Status: AC
  Administered 2014-10-18: 60 mg via ORAL
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
## 2014-10-18 NOTE — Discharge Instructions (Signed)

## 2014-10-18 NOTE — ED Notes (Signed)
Pt started with a cold about 2 weeks ago.  She was hoarse for several days, but her voice is coming back.  Her sinuses have been clogged and her left ear has been hurting.  She has asthma and it has gotten progressively worse with her cold symptoms.  She feels chest pressure and congestion that won't go away.

## 2014-10-21 ENCOUNTER — Telehealth: Payer: Self-pay | Admitting: Family Medicine

## 2014-10-21 NOTE — Telephone Encounter (Signed)
ER letter sent 

## 2015-05-23 ENCOUNTER — Ambulatory Visit (HOSPITAL_COMMUNITY)
Admission: RE | Admit: 2015-05-23 | Discharge: 2015-05-23 | Disposition: A | Discharge status: Home / Self Care | Payer: 59 | Source: Ambulatory Visit | Attending: Physician Assistant | Admitting: Physician Assistant

## 2015-05-23 ENCOUNTER — Encounter (HOSPITAL_COMMUNITY): Payer: Self-pay

## 2015-05-23 ENCOUNTER — Telehealth: Payer: Self-pay

## 2015-05-23 ENCOUNTER — Ambulatory Visit (INDEPENDENT_AMBULATORY_CARE_PROVIDER_SITE_OTHER): Payer: 59 | Admitting: Physician Assistant

## 2015-05-23 VITALS — BP 124/84 | HR 74 | Temp 97.4°F | Resp 17 | Ht 68.0 in | Wt 318.6 lb

## 2015-05-23 DIAGNOSIS — H9203 Otalgia, bilateral: Secondary | ICD-10-CM

## 2015-05-23 DIAGNOSIS — G43909 Migraine, unspecified, not intractable, without status migrainosus: Secondary | ICD-10-CM | POA: Diagnosis not present

## 2015-05-23 DIAGNOSIS — R112 Nausea with vomiting, unspecified: Secondary | ICD-10-CM | POA: Diagnosis not present

## 2015-05-23 DIAGNOSIS — H5500 Unspecified nystagmus: Secondary | ICD-10-CM

## 2015-05-23 DIAGNOSIS — Z8709 Personal history of other diseases of the respiratory system: Secondary | ICD-10-CM | POA: Diagnosis not present

## 2015-05-23 DIAGNOSIS — J0120 Acute ethmoidal sinusitis, unspecified: Secondary | ICD-10-CM | POA: Diagnosis not present

## 2015-05-23 DIAGNOSIS — B379 Candidiasis, unspecified: Secondary | ICD-10-CM | POA: Diagnosis not present

## 2015-05-23 DIAGNOSIS — T3695XA Adverse effect of unspecified systemic antibiotic, initial encounter: Secondary | ICD-10-CM

## 2015-05-23 LAB — POCT CBC
Granulocyte percent: 62.1 %G (ref 37–80)
HCT, POC: 42.1 % (ref 37.7–47.9)
Hemoglobin: 13.8 g/dL (ref 12.2–16.2)
Lymph, poc: 3.8 — AB (ref 0.6–3.4)
MCH, POC: 28.2 pg (ref 27–31.2)
MCHC: 32.8 g/dL (ref 31.8–35.4)
MCV: 86.1 fL (ref 80–97)
MID (CBC): 0.7 (ref 0–0.9)
MPV: 7.2 fL (ref 0–99.8)
PLATELET COUNT, POC: 375 10*3/uL (ref 142–424)
POC GRANULOCYTE: 7.3 — AB (ref 2–6.9)
POC LYMPH %: 32.3 % (ref 10–50)
POC MID %: 5.6 %M (ref 0–12)
RBC: 4.89 M/uL (ref 4.04–5.48)
RDW, POC: 14 %
WBC: 11.7 10*3/uL — AB (ref 4.6–10.2)

## 2015-05-23 LAB — POCT URINE PREGNANCY: PREG TEST UR: NEGATIVE

## 2015-05-23 MED ORDER — FLUCONAZOLE 150 MG PO TABS: 150.0000 mg | ORAL_TABLET | Freq: Once | ORAL | Status: DC

## 2015-05-23 MED ORDER — ONDANSETRON 8 MG PO TBDP: 8.0000 mg | ORAL_TABLET | Freq: Three times a day (TID) | ORAL | Status: DC | PRN

## 2015-05-23 MED ORDER — PREDNISONE 20 MG PO TABS: ORAL_TABLET | ORAL | Status: DC

## 2015-05-23 MED ORDER — DOXYCYCLINE HYCLATE 100 MG PO CAPS: 100.0000 mg | ORAL_CAPSULE | Freq: Two times a day (BID) | ORAL | Status: AC
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
## 2015-05-23 NOTE — Patient Instructions (Signed)
Please report to [facility] for your scheduled CT  Go to the main entrance and ask to be directed to the Radiology Department  Someone will get you from there

## 2015-05-23 NOTE — Telephone Encounter (Signed)
Patient is requesting medication for nausea.  She was seen this morning - sent for scan.     [facility]   [phone]

## 2015-05-26 NOTE — Progress Notes (Signed)
  Medical screening examination/treatment/procedure(s) were performed by non-physician practitioner and as supervising physician I was immediately available for consultation/collaboration.     

## 2016-06-10 ENCOUNTER — Ambulatory Visit (INDEPENDENT_AMBULATORY_CARE_PROVIDER_SITE_OTHER): Payer: 59 | Admitting: Physician Assistant

## 2016-06-10 VITALS — BP 134/92 | HR 88 | Temp 98.7°F | Resp 16 | Ht 69.0 in | Wt 320.0 lb

## 2016-06-10 DIAGNOSIS — F418 Other specified anxiety disorders: Secondary | ICD-10-CM | POA: Diagnosis not present

## 2016-06-10 DIAGNOSIS — Z9109 Other allergy status, other than to drugs and biological substances: Secondary | ICD-10-CM

## 2016-06-10 DIAGNOSIS — F329 Major depressive disorder, single episode, unspecified: Secondary | ICD-10-CM

## 2016-06-10 DIAGNOSIS — Z91048 Other nonmedicinal substance allergy status: Secondary | ICD-10-CM

## 2016-06-10 DIAGNOSIS — F419 Anxiety disorder, unspecified: Principal | ICD-10-CM

## 2016-06-10 MED ORDER — PAROXETINE HCL 40 MG PO TABS: 20.0000 mg | ORAL_TABLET | ORAL | Status: DC

## 2016-06-10 NOTE — Patient Instructions (Addendum)
  Take Zyrtec-D in the morning and at night. This can be purchased over the counter at the pharmacy.   IF you received an x-ray today, you will receive an invoice from [facility]. Please contact [facility] at [phone] with questions or concerns regarding your invoice.   IF you received labwork today, you will receive an invoice from [facility]. Please contact [facility] at [phone] with questions or concerns regarding your invoice.   Our billing staff will not be able to assist you with questions regarding bills from these companies.  You will be contacted with the lab results as soon as they are available. The fastest way to get your results is to activate your My Chart account. Instructions are located on the last page of this paperwork. If you have not heard from us regarding the results in 2 weeks, please contact this office.

## 2016-06-10 NOTE — Progress Notes (Signed)
06/10/2016 2:07 PM   DOB: 05/24/1981 / MRN: [id number]  SUBJECTIVE:  Shawna Kennedy is a 35 y.o. female presenting for refills of medication.  She has been taking Xanax and Zolpidem for anxiety and depression which she reports is getting worse.  Denies SI/HI however complains of poor sleep, anhedonia and dysthymic mood.  She feels these are getting worse. She once took paroxetine and reports that this did help.  She last saw her PCP three months ago, states that she will not be going back there but did not give a reason.    She states that she is checking her BP often and it runs roughly 120/80.    Depression screen PHQ 2/9 06/10/2016  Decreased Interest 3  Down, Depressed, Hopeless 2  PHQ - 2 Score 5  Tired, decreased energy 1  Change in appetite 2  Feeling bad or failure about yourself  2  Trouble concentrating 1  Moving slowly or fidgety/restless 0  Suicidal thoughts 0     She is allergic to ciprofloxacin hcl; omni-pac; penicillins; and sulfa antibiotics.   She  has a past medical history of Lactose intolerance; Allergy; Insomnia; Migraine headache; Asthma; Lumbago (2002); Anxiety; and PONV (postoperative nausea and vomiting).    She  reports that she has been smoking Cigarettes.  She has been smoking about 0.30 packs per day. She has never used smokeless tobacco. She reports that she drinks alcohol. She reports that she does not use illicit drugs. She  has no sexual activity history on file. The patient  has past surgical history that includes Tonsillectomy and adenoidectomy; Tympanostomy; Tonsillectomy; Lumbar laminectomy (Left, 04/30/2013); and Back surgery.  Her family history is not on file.  Review of Systems  Constitutional: Negative for fever and chills.  HENT: Positive for congestion. Negative for ear discharge, ear pain and sore throat.   Respiratory: Negative for cough.   Cardiovascular: Negative for chest pain.  Gastrointestinal: Negative for nausea.  Skin: Negative  for itching and rash.  Neurological: Positive for headaches. Negative for dizziness.  Psychiatric/Behavioral: Positive for depression and suicidal ideas.    Problem list and medications reviewed and updated by myself where necessary, and exist elsewhere in the encounter.   OBJECTIVE:  BP 134/92 mmHg  Pulse 88  Temp(Src) 98.7 F (37.1 C) (Oral)  Resp 16  Ht  (1.753 m)  Wt 320 lb (145.151 kg)  BMI 47.23 kg/m2  SpO2 100%  LMP 06/04/2016  Physical Exam  Constitutional: She is oriented to person, place, and time. Vital signs are normal. She appears well-nourished. No distress.  HENT:  Right Ear: Tympanic membrane normal.  Left Ear: Tympanic membrane normal.  Nose: Nose normal.  Mouth/Throat: Uvula is midline, oropharynx is clear and moist and mucous membranes are normal.  Eyes: EOM are normal. Pupils are equal, round, and reactive to light.  Cardiovascular: Normal rate, regular rhythm and normal heart sounds.   Pulmonary/Chest: Effort normal and breath sounds normal.  Abdominal: Soft. Bowel sounds are normal. She exhibits no distension.  Musculoskeletal: Normal range of motion.  Neurological: She is alert and oriented to person, place, and time. No cranial nerve deficit. Gait normal.  Skin: Skin is dry. She is not diaphoretic.  Psychiatric: She has a normal mood and affect.  Vitals reviewed.   No results found for this or any previous visit (from the past 72 hour(s)).  No results found.  ASSESSMENT AND PLAN  Makhia was seen today for sinus problem and depression.  Diagnoses and all orders for this visit:  Anxiety and depression -     PARoxetine (PAXIL) 40 MG tablet; Take 0.5 tablets (20 mg total) by mouth every morning. Please establish with a PCP for refills.  Environmental allergies: Zyrtec-D.    The patient was advised to call or return to clinic if she does not see an improvement in symptoms, or to seek the care of the closest emergency department if she  worsens with the above plan.   Deliah BostonMichael Diron Haddon, MHS, PA-C [facility] 06/10/2016 2:07 PM

## 2016-06-10 NOTE — Progress Notes (Signed)
   06/10/2016 1:58 PM   DOB: 08-Sep-1981 / MRN: [id number]  SUBJECTIVE:  Shawna Kennedy is a 35 y.o. female presenting for   She is allergic to ciprofloxacin hcl; omni-pac; penicillins; and sulfa antibiotics.   She  has a past medical history of Lactose intolerance; Allergy; Insomnia; Migraine headache; Asthma; Lumbago (2002); Anxiety; and PONV (postoperative nausea and vomiting).    She  reports that she has been smoking Cigarettes.  She has been smoking about 0.30 packs per day. She has never used smokeless tobacco. She reports that she drinks alcohol. She reports that she does not use illicit drugs. She  has no sexual activity history on file. The patient  has past surgical history that includes Tonsillectomy and adenoidectomy; Tympanostomy; Tonsillectomy; Lumbar laminectomy (Left, 04/30/2013); and Back surgery.  Her family history is not on file.  ROS  Problem list and medications reviewed and updated by myself where necessary, and exist elsewhere in the encounter.   OBJECTIVE:  BP 134/92 mmHg  Pulse 88  Temp(Src) 98.7 F (37.1 C) (Oral)  Resp 16  Ht 5\' 9"  (1.753 m)  Wt 320 lb (145.151 kg)  BMI 47.23 kg/m2  SpO2 100%  LMP 06/04/2016  Physical Exam  No results found for this or any previous visit (from the past 72 hour(s)).  No results found.  ASSESSMENT AND PLAN  There are no diagnoses linked to this encounter.  The patient was advised to call or return to clinic if she does not see an improvement in symptoms, or to seek the care of the closest emergency department if she worsens with the above plan.   Deliah BostonMichael Hisae Decoursey, MHS, PA-C [facility] 06/10/2016 1:58 PM

## 2016-09-08 ENCOUNTER — Ambulatory Visit (HOSPITAL_COMMUNITY)
Admission: EM | Admit: 2016-09-08 | Discharge: 2016-09-08 | Disposition: A | Discharge status: Home / Self Care | Payer: 59 | Attending: Physician Assistant | Admitting: Physician Assistant

## 2016-09-08 ENCOUNTER — Encounter (HOSPITAL_COMMUNITY): Payer: Self-pay | Admitting: Family Medicine

## 2016-09-08 DIAGNOSIS — J0100 Acute maxillary sinusitis, unspecified: Secondary | ICD-10-CM | POA: Diagnosis not present

## 2016-09-08 DIAGNOSIS — H7293 Unspecified perforation of tympanic membrane, bilateral: Secondary | ICD-10-CM

## 2016-09-08 DIAGNOSIS — R519 Headache, unspecified: Secondary | ICD-10-CM

## 2016-09-08 DIAGNOSIS — R51 Headache: Secondary | ICD-10-CM | POA: Diagnosis not present

## 2016-09-08 MED ORDER — DOXYCYCLINE HYCLATE 100 MG PO CAPS: 100.0000 mg | ORAL_CAPSULE | Freq: Two times a day (BID) | ORAL | 0 refills | Status: DC

## 2016-09-08 MED ORDER — FLUTICASONE PROPIONATE 50 MCG/ACT NA SUSP: 2.0000 | Freq: Every day | NASAL | 2 refills | Status: DC

## 2016-09-08 NOTE — ED Triage Notes (Signed)
Pt here for sinus pressure, headache, mucous.

## 2016-10-04 ENCOUNTER — Ambulatory Visit (INDEPENDENT_AMBULATORY_CARE_PROVIDER_SITE_OTHER): Payer: 59 | Admitting: Family Medicine

## 2016-10-04 ENCOUNTER — Encounter: Payer: Self-pay | Admitting: Family Medicine

## 2016-10-04 VITALS — BP 132/98 | HR 83 | Temp 98.4°F | Resp 12 | Ht 69.0 in | Wt 333.1 lb

## 2016-10-04 DIAGNOSIS — R030 Elevated blood-pressure reading, without diagnosis of hypertension: Secondary | ICD-10-CM

## 2016-10-04 DIAGNOSIS — J309 Allergic rhinitis, unspecified: Secondary | ICD-10-CM

## 2016-10-04 DIAGNOSIS — F419 Anxiety disorder, unspecified: Secondary | ICD-10-CM | POA: Diagnosis not present

## 2016-10-04 DIAGNOSIS — G4700 Insomnia, unspecified: Secondary | ICD-10-CM | POA: Diagnosis not present

## 2016-10-04 DIAGNOSIS — M542 Cervicalgia: Secondary | ICD-10-CM | POA: Insufficient documentation

## 2016-10-04 DIAGNOSIS — G43709 Chronic migraine without aura, not intractable, without status migrainosus: Secondary | ICD-10-CM

## 2016-10-04 DIAGNOSIS — Z6841 Body Mass Index (BMI) 40.0 and over, adult: Secondary | ICD-10-CM

## 2016-10-04 MED ORDER — AMITRIPTYLINE HCL 25 MG PO TABS: 25.0000 mg | ORAL_TABLET | Freq: Every day | ORAL | 1 refills | Status: DC

## 2016-10-04 MED ORDER — FLUOXETINE HCL 20 MG PO TABS: 10.0000 mg | ORAL_TABLET | Freq: Every day | ORAL | 3 refills | Status: DC

## 2016-10-04 MED ORDER — BACLOFEN 10 MG PO TABS: 10.0000 mg | ORAL_TABLET | Freq: Two times a day (BID) | ORAL | 0 refills | Status: DC | PRN

## 2016-10-04 NOTE — Patient Instructions (Addendum)
A few things to remember from today's visit:   Insomnia, unspecified type  Allergic rhinitis, unspecified chronicity, unspecified seasonality, unspecified trigger  Morbid obesity with BMI of 45.0-49.9, adult (HCC)  Anxiety disorder, unspecified type  Elevated blood-pressure reading without diagnosis of hypertension  What are some tips for weight loss? People become overweight for many reasons. Weight issues can run in families. They can be caused by unhealthy behaviors and a person's environment. Certain health problems and medicines can also lead to weight gain. There are some simple things you can do to reach and maintain a healthy weight:  Eat small more frequent healthy meals instead 3 bid meals. Also Weight Watchers is a good option. Avoid sweet drinks. These include regular soft drinks, fruit juices, fruit drinks, energy drinks, sweetened iced tea, and flavored milk. Avoid fast foods. Fast foods such as french fries, hamburgers, chicken nuggets, and pizza are high in calories and can cause weight gain. Eat a healthy breakfast. People who skip breakfast tend to weigh more. Don't watch more than two hours of television per day. Chew sugar-free gum between meals to cut down on snacking. Avoid grocery shopping when you're hungry. Pack a healthy lunch instead of eating out to control what and how much you eat. Eat a lot of fruits and vegetables. Aim for about 2 cups of fruit and 2 to 3 cups of vegetables per day. Aim for 150 minutes per week of moderate-intensity exercise (such as brisk walking), or 75 minutes per week of vigorous exercise (such as jogging or running). OR 15-30 min of daily brisk walking. Be more active. Small changes in physical activity can easily be added to your daily routine. For example, take the stairs instead of the elevator. Take a walk with your family. A daily walk is a great way to get exercise and to catch up on the day's events.    ? What can I do to  sleep better?   Improving your sleep habits is a good start.  Medical or psychiatric conditions might be making your insomnia worse.  Medicine might help, but you shouldn't use sleeping pills long term.   Some people need more sleep than others.   Sleep usually occurs in two- to three-hour cycles, so it is important to get at least three uninterrupted hours of sleep.  The following tips can help you develop better sleep habits: Go to bed and wake up at the same time each day Lie down to sleep only when sleepy. If you can't sleep after 20 minutes, get out of bed and go to another room; return to the bedroom when you are tired; repeat as necessary. Use bedroom for sleep and sex only. Don't do things in bed that might keep you awake, like watching television, reading, talking on the phone, or worrying Avoid caffeine, nicotine, or alcohol for at least four to six hours beforebedtime\ls1Avoid strenuous exercise within four hours of bedtime. Avoid daytime napping. Relax before going to bed. Avoid eating large meals or drinking a lot of water or other liquids in the evening. Keep the bedroom a comfortable temperature. Use earplugs if noise is a problem. Expose yourself to daytime light for at least 30 minutes each morning     Monitor blood pressure at home.
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
## 2016-10-04 NOTE — Progress Notes (Signed)
Pre visit review using our clinic review tool, if applicable. No additional management support is needed unless otherwise documented below in the visit note. 

## 2016-10-06 ENCOUNTER — Encounter: Payer: Self-pay | Admitting: Family Medicine

## 2016-11-01 ENCOUNTER — Encounter: Payer: Self-pay | Admitting: Family Medicine

## 2016-11-01 ENCOUNTER — Ambulatory Visit (INDEPENDENT_AMBULATORY_CARE_PROVIDER_SITE_OTHER): Payer: 59 | Admitting: Family Medicine

## 2016-11-01 VITALS — BP 119/80 | HR 88 | Temp 98.1°F | Ht 69.0 in | Wt 336.8 lb

## 2016-11-01 DIAGNOSIS — G43709 Chronic migraine without aura, not intractable, without status migrainosus: Secondary | ICD-10-CM

## 2016-11-01 DIAGNOSIS — G4710 Hypersomnia, unspecified: Secondary | ICD-10-CM

## 2016-11-01 DIAGNOSIS — F411 Generalized anxiety disorder: Secondary | ICD-10-CM | POA: Diagnosis not present

## 2016-11-01 DIAGNOSIS — G4700 Insomnia, unspecified: Secondary | ICD-10-CM | POA: Diagnosis not present

## 2016-11-01 DIAGNOSIS — K219 Gastro-esophageal reflux disease without esophagitis: Secondary | ICD-10-CM

## 2016-11-01 DIAGNOSIS — M542 Cervicalgia: Secondary | ICD-10-CM

## 2016-11-01 DIAGNOSIS — Z6841 Body Mass Index (BMI) 40.0 and over, adult: Secondary | ICD-10-CM

## 2016-11-01 MED ORDER — BACLOFEN 10 MG PO TABS: 10.0000 mg | ORAL_TABLET | Freq: Two times a day (BID) | ORAL | 0 refills | Status: DC | PRN

## 2016-11-01 MED ORDER — DICLOFENAC SODIUM 75 MG PO TBEC: 75.0000 mg | DELAYED_RELEASE_TABLET | Freq: Every day | ORAL | 0 refills | Status: DC | PRN

## 2016-11-01 MED ORDER — AMITRIPTYLINE HCL 25 MG PO TABS: 25.0000 mg | ORAL_TABLET | Freq: Every day | ORAL | 1 refills | Status: DC

## 2016-11-01 MED ORDER — OMEPRAZOLE 20 MG PO CPDR: 20.0000 mg | DELAYED_RELEASE_CAPSULE | Freq: Every day | ORAL | 3 refills | Status: DC

## 2016-11-01 NOTE — Progress Notes (Signed)
Pre visit review using our clinic review tool, if applicable. No additional management support is needed unless otherwise documented below in the visit note. 

## 2016-11-01 NOTE — Patient Instructions (Addendum)
A few things to remember from today's visit:   Chronic migraine without aura without status migrainosus, not intractable - Plan: amitriptyline (ELAVIL) 25 MG tablet, diclofenac (VOLTAREN) 75 MG EC tablet  Anxiety state  Insomnia, unspecified type - Plan: amitriptyline (ELAVIL) 25 MG tablet  Hypersomnolence - Plan: Ambulatory referral to Pulmonology  Cervicalgia - Plan: baclofen (LIORESAL) 10 MG tablet    Avoid foods that make your symptoms worse, for example coffee, chocolate,pepermeint,alcohol, and greasy food. Raising the head of your bed about 6 inches may help with nocturnal symptoms.  Avoid tobacco use. Weight loss (if you are overweight). Avoid lying down for 3 hours after eating.  Instead 3 large meals daily try small and more frequent meals during the day.  Some medications we recommend for acid reflux treatment (proton pump inhibitors) can cause some problems in the long term: increase risk of osteoporosis, vitamin deficiencies,pneumonia, and more recently discovered that it can increase the risk of chronic kidney disease and might increase risk of dementia.  You should be evaluated immediately if bloody vomiting, bloody stools, black stools (like tar), difficulty swallowing, food gets stuck on the way down or choking when eating. Abnormal weight loss or severe abdominal pain.   Please be sure medication list is accurate. If a new problem present, please set up appointment sooner than planned today.
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
## 2016-11-04 ENCOUNTER — Telehealth: Payer: Self-pay

## 2016-11-04 NOTE — Telephone Encounter (Signed)
Called and left VM letting patient know FMLA paper work is up front ready for pick up & that I will fax the copy that was made.

## 2016-12-03 ENCOUNTER — Other Ambulatory Visit: Payer: Self-pay | Admitting: Family Medicine

## 2016-12-03 DIAGNOSIS — G43709 Chronic migraine without aura, not intractable, without status migrainosus: Secondary | ICD-10-CM

## 2016-12-03 NOTE — Telephone Encounter (Signed)
Okay to refill? 

## 2016-12-09 ENCOUNTER — Telehealth: Payer: Self-pay

## 2016-12-09 NOTE — Telephone Encounter (Signed)
Called and left voicemail letting patient know that paper work is completed & ready to be picked up. Asked if paper work needed to be faxed or if she just wanted to pick it up. Asked for a return call with that information.

## 2016-12-23 ENCOUNTER — Ambulatory Visit (HOSPITAL_COMMUNITY)
Admission: EM | Admit: 2016-12-23 | Discharge: 2016-12-23 | Disposition: A | Discharge status: Home / Self Care | Payer: Managed Care, Other (non HMO) | Attending: Family Medicine | Admitting: Family Medicine

## 2016-12-23 ENCOUNTER — Encounter (HOSPITAL_COMMUNITY): Payer: Self-pay | Admitting: Emergency Medicine

## 2016-12-23 DIAGNOSIS — G43001 Migraine without aura, not intractable, with status migrainosus: Secondary | ICD-10-CM | POA: Diagnosis not present

## 2016-12-23 DIAGNOSIS — H65112 Acute and subacute allergic otitis media (mucoid) (sanguinous) (serous), left ear: Secondary | ICD-10-CM | POA: Diagnosis not present

## 2016-12-23 LAB — POCT PREGNANCY, URINE: Preg Test, Ur: NEGATIVE

## 2016-12-23 MED ORDER — CLINDAMYCIN HCL 150 MG PO CAPS: 150.0000 mg | ORAL_CAPSULE | Freq: Four times a day (QID) | ORAL | 0 refills | Status: DC

## 2016-12-23 NOTE — ED Triage Notes (Signed)
Pt c/o cold sx onset: 1 week  Sx include: facial pressure, dizziness, bilateral ear pressure, nauseas, chills  Denies: fevers  Taking: OTC cold meds w/no relief.   A&O x4... NAD
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
## 2017-01-04 ENCOUNTER — Institutional Professional Consult (permissible substitution): Payer: 59 | Admitting: Pulmonary Disease

## 2017-01-31 ENCOUNTER — Ambulatory Visit: Payer: 59 | Admitting: Family Medicine

## 2017-02-04 ENCOUNTER — Other Ambulatory Visit: Payer: Self-pay | Admitting: Family Medicine

## 2017-02-04 DIAGNOSIS — G4700 Insomnia, unspecified: Secondary | ICD-10-CM

## 2017-02-04 DIAGNOSIS — G43709 Chronic migraine without aura, not intractable, without status migrainosus: Secondary | ICD-10-CM

## 2017-03-14 ENCOUNTER — Ambulatory Visit: Payer: Managed Care, Other (non HMO) | Admitting: Family Medicine

## 2017-03-21 ENCOUNTER — Ambulatory Visit: Payer: Managed Care, Other (non HMO) | Admitting: Family Medicine

## 2017-07-23 ENCOUNTER — Encounter (HOSPITAL_COMMUNITY): Payer: Self-pay | Admitting: Emergency Medicine

## 2017-07-23 ENCOUNTER — Emergency Department (HOSPITAL_COMMUNITY)
Admission: EM | Admit: 2017-07-23 | Discharge: 2017-07-23 | Disposition: A | Discharge status: Home / Self Care | Payer: Self-pay | Attending: Emergency Medicine | Admitting: Emergency Medicine

## 2017-07-23 ENCOUNTER — Emergency Department (HOSPITAL_COMMUNITY): Payer: Self-pay

## 2017-07-23 DIAGNOSIS — R0602 Shortness of breath: Secondary | ICD-10-CM

## 2017-07-23 DIAGNOSIS — J4541 Moderate persistent asthma with (acute) exacerbation: Secondary | ICD-10-CM

## 2017-07-23 DIAGNOSIS — Z79899 Other long term (current) drug therapy: Secondary | ICD-10-CM | POA: Insufficient documentation

## 2017-07-23 DIAGNOSIS — F17210 Nicotine dependence, cigarettes, uncomplicated: Secondary | ICD-10-CM | POA: Insufficient documentation

## 2017-07-23 LAB — BASIC METABOLIC PANEL
ANION GAP: 8 (ref 5–15)
BUN: 8 mg/dL (ref 6–20)
CO2: 24 mmol/L (ref 22–32)
CREATININE: 0.67 mg/dL (ref 0.44–1.00)
Calcium: 8.9 mg/dL (ref 8.9–10.3)
Chloride: 108 mmol/L (ref 101–111)
GLUCOSE: 98 mg/dL (ref 65–99)
Potassium: 3.8 mmol/L (ref 3.5–5.1)
Sodium: 140 mmol/L (ref 135–145)

## 2017-07-23 LAB — CBC
HCT: 44.3 % (ref 36.0–46.0)
Hemoglobin: 15.1 g/dL — ABNORMAL HIGH (ref 12.0–15.0)
MCH: 28.9 pg (ref 26.0–34.0)
MCHC: 34.1 g/dL (ref 30.0–36.0)
MCV: 84.9 fL (ref 78.0–100.0)
Platelets: 363 10*3/uL (ref 150–400)
RBC: 5.22 MIL/uL — AB (ref 3.87–5.11)
RDW: 14 % (ref 11.5–15.5)
WBC: 12.8 10*3/uL — ABNORMAL HIGH (ref 4.0–10.5)

## 2017-07-23 MED ORDER — ALBUTEROL SULFATE (2.5 MG/3ML) 0.083% IN NEBU
5.0000 mg | INHALATION_SOLUTION | Freq: Once | RESPIRATORY_TRACT | Status: AC
  Administered 2017-07-23: 5 mg via RESPIRATORY_TRACT
  Filled 2017-07-23: qty 6

## 2017-07-23 MED ORDER — IPRATROPIUM-ALBUTEROL 0.5-2.5 (3) MG/3ML IN SOLN
3.0000 mL | Freq: Once | RESPIRATORY_TRACT | Status: AC
  Administered 2017-07-23: 3 mL via RESPIRATORY_TRACT
  Filled 2017-07-23: qty 3

## 2017-07-23 MED ORDER — PREDNISONE 10 MG PO TABS: 40.0000 mg | ORAL_TABLET | Freq: Every day | ORAL | 0 refills | Status: AC

## 2017-07-23 MED ORDER — PREDNISONE 20 MG PO TABS
60.0000 mg | ORAL_TABLET | Freq: Once | ORAL | Status: AC
  Administered 2017-07-23: 60 mg via ORAL
  Filled 2017-07-23: qty 3

## 2017-07-23 MED ORDER — ALBUTEROL SULFATE HFA 108 (90 BASE) MCG/ACT IN AERS
2.0000 | INHALATION_SPRAY | Freq: Once | RESPIRATORY_TRACT | Status: DC
  Filled 2017-07-23: qty 6.7

## 2017-07-23 NOTE — Discharge Instructions (Signed)
Usual albuterol inhaler scheduled for the next 36 hours; 4 puffs every 4 hours while awake. After this he may use 2-4 puffs every 4-6 hours for symptomatic relief.

## 2017-07-23 NOTE — ED Triage Notes (Addendum)
Pt reports an asthma flare up since Tuesday. Pt also reports hot/cold chills; no fever in triage. No obvious wheezing. Pt thinks her home inhaler is out of date.

## 2017-07-23 NOTE — ED Notes (Signed)
Pt reports she feels better after her breathing tx. Pt to wait in lobby for room
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
## 2017-08-02 ENCOUNTER — Encounter (HOSPITAL_COMMUNITY): Payer: Self-pay | Admitting: Emergency Medicine

## 2017-08-02 ENCOUNTER — Emergency Department (HOSPITAL_COMMUNITY)
Admission: EM | Admit: 2017-08-02 | Discharge: 2017-08-02 | Disposition: A | Discharge status: Home / Self Care | Payer: Self-pay | Attending: Emergency Medicine | Admitting: Emergency Medicine

## 2017-08-02 DIAGNOSIS — F17210 Nicotine dependence, cigarettes, uncomplicated: Secondary | ICD-10-CM | POA: Insufficient documentation

## 2017-08-02 DIAGNOSIS — G43009 Migraine without aura, not intractable, without status migrainosus: Secondary | ICD-10-CM

## 2017-08-02 DIAGNOSIS — Z79899 Other long term (current) drug therapy: Secondary | ICD-10-CM | POA: Insufficient documentation

## 2017-08-02 DIAGNOSIS — R197 Diarrhea, unspecified: Secondary | ICD-10-CM | POA: Insufficient documentation

## 2017-08-02 DIAGNOSIS — J0100 Acute maxillary sinusitis, unspecified: Secondary | ICD-10-CM

## 2017-08-02 DIAGNOSIS — J45909 Unspecified asthma, uncomplicated: Secondary | ICD-10-CM | POA: Insufficient documentation

## 2017-08-02 LAB — URINALYSIS, ROUTINE W REFLEX MICROSCOPIC
BILIRUBIN URINE: NEGATIVE
GLUCOSE, UA: NEGATIVE mg/dL
KETONES UR: NEGATIVE mg/dL
LEUKOCYTES UA: NEGATIVE
NITRITE: NEGATIVE
PH: 5 (ref 5.0–8.0)
Protein, ur: NEGATIVE mg/dL
SPECIFIC GRAVITY, URINE: 1.009 (ref 1.005–1.030)

## 2017-08-02 LAB — I-STAT CHEM 8, ED
BUN: 7 mg/dL (ref 6–20)
CREATININE: 0.6 mg/dL (ref 0.44–1.00)
Calcium, Ion: 1.09 mmol/L — ABNORMAL LOW (ref 1.15–1.40)
Chloride: 106 mmol/L (ref 101–111)
GLUCOSE: 133 mg/dL — AB (ref 65–99)
HCT: 45 % (ref 36.0–46.0)
Hemoglobin: 15.3 g/dL — ABNORMAL HIGH (ref 12.0–15.0)
Potassium: 3.4 mmol/L — ABNORMAL LOW (ref 3.5–5.1)
Sodium: 142 mmol/L (ref 135–145)
TCO2: 23 mmol/L (ref 22–32)

## 2017-08-02 MED ORDER — SODIUM CHLORIDE 0.9 % IV SOLN
INTRAVENOUS | Status: AC
  Administered 2017-08-02: 13:00:00 via INTRAVENOUS

## 2017-08-02 MED ORDER — DOXYCYCLINE HYCLATE 100 MG PO CAPS: 100.0000 mg | ORAL_CAPSULE | Freq: Two times a day (BID) | ORAL | 0 refills | Status: DC

## 2017-08-02 MED ORDER — PSEUDOEPHEDRINE HCL 30 MG PO TABS: 30.0000 mg | ORAL_TABLET | Freq: Three times a day (TID) | ORAL | 0 refills | Status: DC | PRN

## 2017-08-02 MED ORDER — DEXAMETHASONE SODIUM PHOSPHATE 10 MG/ML IJ SOLN
10.0000 mg | Freq: Once | INTRAMUSCULAR | Status: AC
  Administered 2017-08-02: 10 mg via INTRAVENOUS
  Filled 2017-08-02: qty 1

## 2017-08-02 MED ORDER — METOCLOPRAMIDE HCL 5 MG/ML IJ SOLN
10.0000 mg | Freq: Once | INTRAMUSCULAR | Status: AC
  Administered 2017-08-02: 10 mg via INTRAVENOUS
  Filled 2017-08-02: qty 2

## 2017-08-02 MED ORDER — DIPHENHYDRAMINE HCL 50 MG/ML IJ SOLN
25.0000 mg | Freq: Once | INTRAMUSCULAR | Status: AC
  Administered 2017-08-02: 25 mg via INTRAVENOUS
  Filled 2017-08-02: qty 1

## 2017-08-02 NOTE — Discharge Instructions (Signed)
Take tylenol or ibuprofen as needed for headache. Return for worsening symptoms.
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
## 2017-08-02 NOTE — ED Triage Notes (Signed)
Pt reports r/ear pressure , frontal headache. Chills x 5 days Denies fever, denies nausea. Tx with OTC allergy medications.

## 2017-09-23 ENCOUNTER — Encounter (HOSPITAL_COMMUNITY): Payer: Self-pay | Admitting: *Deleted

## 2017-09-23 ENCOUNTER — Emergency Department (HOSPITAL_COMMUNITY)
Admission: EM | Admit: 2017-09-23 | Discharge: 2017-09-23 | Disposition: A | Discharge status: Home / Self Care | Payer: No Typology Code available for payment source | Attending: Emergency Medicine | Admitting: Emergency Medicine

## 2017-09-23 DIAGNOSIS — M545 Low back pain: Secondary | ICD-10-CM | POA: Insufficient documentation

## 2017-09-23 DIAGNOSIS — F17210 Nicotine dependence, cigarettes, uncomplicated: Secondary | ICD-10-CM | POA: Insufficient documentation

## 2017-09-23 DIAGNOSIS — Z79899 Other long term (current) drug therapy: Secondary | ICD-10-CM | POA: Insufficient documentation

## 2017-09-23 DIAGNOSIS — R51 Headache: Secondary | ICD-10-CM | POA: Diagnosis not present

## 2017-09-23 DIAGNOSIS — J45909 Unspecified asthma, uncomplicated: Secondary | ICD-10-CM | POA: Diagnosis not present

## 2017-09-23 DIAGNOSIS — R519 Headache, unspecified: Secondary | ICD-10-CM

## 2017-09-23 MED ORDER — DIPHENHYDRAMINE HCL 50 MG/ML IJ SOLN
25.0000 mg | Freq: Once | INTRAMUSCULAR | Status: AC
  Administered 2017-09-23: 25 mg via INTRAVENOUS
  Filled 2017-09-23: qty 1

## 2017-09-23 MED ORDER — METOCLOPRAMIDE HCL 5 MG/ML IJ SOLN
10.0000 mg | Freq: Once | INTRAMUSCULAR | Status: AC
  Administered 2017-09-23: 10 mg via INTRAVENOUS
  Filled 2017-09-23: qty 2

## 2017-09-23 MED ORDER — BUTALBITAL-APAP-CAFFEINE 50-325-40 MG PO TABS: 1.0000 | ORAL_TABLET | Freq: Four times a day (QID) | ORAL | 0 refills | Status: AC | PRN

## 2017-09-23 MED ORDER — KETOROLAC TROMETHAMINE 30 MG/ML IJ SOLN
30.0000 mg | Freq: Once | INTRAMUSCULAR | Status: AC
  Administered 2017-09-23: 30 mg via INTRAVENOUS
  Filled 2017-09-23: qty 1

## 2017-09-23 MED ORDER — DEXAMETHASONE SODIUM PHOSPHATE 10 MG/ML IJ SOLN
10.0000 mg | Freq: Once | INTRAMUSCULAR | Status: AC
  Administered 2017-09-23: 10 mg via INTRAVENOUS
  Filled 2017-09-23: qty 1

## 2017-09-23 MED ORDER — CYCLOBENZAPRINE HCL 10 MG PO TABS: 10.0000 mg | ORAL_TABLET | Freq: Two times a day (BID) | ORAL | 0 refills | Status: DC | PRN

## 2017-09-23 MED ORDER — SODIUM CHLORIDE 0.9 % IV BOLUS (SEPSIS)
1000.0000 mL | Freq: Once | INTRAVENOUS | Status: AC
  Administered 2017-09-23: 1000 mL via INTRAVENOUS

## 2017-09-23 NOTE — ED Triage Notes (Signed)
Pt complains of headache and lower back pain for the past week. Pt has tried sinus medication w/o relief. Pt has hx of migraines. Pt states symptoms are lasting longer than usual.
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
## 2017-09-23 NOTE — ED Notes (Signed)
Patient also reports lower back pain, chronic, due to back surgery 4 years ago causing numbness/tingling to LLE. Pain 8/10

## 2017-11-17 ENCOUNTER — Telehealth: Payer: Self-pay | Admitting: *Deleted

## 2017-11-17 ENCOUNTER — Ambulatory Visit: Payer: PRIVATE HEALTH INSURANCE | Admitting: Neurology

## 2017-11-17 NOTE — Telephone Encounter (Signed)
Patient no show new pt appt on 11/17/2017 @ 8:00.

## 2017-11-23 ENCOUNTER — Encounter: Payer: Self-pay | Admitting: Neurology

## 2018-01-11 ENCOUNTER — Emergency Department (HOSPITAL_COMMUNITY)
Admission: EM | Admit: 2018-01-11 | Discharge: 2018-01-11 | Disposition: A | Discharge status: Home / Self Care | Payer: No Typology Code available for payment source | Attending: Emergency Medicine | Admitting: Emergency Medicine

## 2018-01-11 ENCOUNTER — Encounter (HOSPITAL_COMMUNITY): Payer: Self-pay | Admitting: Emergency Medicine

## 2018-01-11 DIAGNOSIS — F17210 Nicotine dependence, cigarettes, uncomplicated: Secondary | ICD-10-CM | POA: Insufficient documentation

## 2018-01-11 DIAGNOSIS — J45909 Unspecified asthma, uncomplicated: Secondary | ICD-10-CM | POA: Insufficient documentation

## 2018-01-11 DIAGNOSIS — G43819 Other migraine, intractable, without status migrainosus: Secondary | ICD-10-CM | POA: Insufficient documentation

## 2018-01-11 DIAGNOSIS — H6690 Otitis media, unspecified, unspecified ear: Secondary | ICD-10-CM | POA: Insufficient documentation

## 2018-01-11 MED ORDER — CEFDINIR 300 MG PO CAPS: 300.0000 mg | ORAL_CAPSULE | Freq: Two times a day (BID) | ORAL | 0 refills | Status: DC

## 2018-01-11 MED ORDER — KETOROLAC TROMETHAMINE 60 MG/2ML IM SOLN
60.0000 mg | Freq: Once | INTRAMUSCULAR | Status: AC
  Administered 2018-01-11: 60 mg via INTRAMUSCULAR
  Filled 2018-01-11: qty 2

## 2018-01-11 MED ORDER — DEXAMETHASONE SODIUM PHOSPHATE 10 MG/ML IJ SOLN
10.0000 mg | Freq: Once | INTRAMUSCULAR | Status: AC
  Administered 2018-01-11: 10 mg via INTRAMUSCULAR
  Filled 2018-01-11: qty 1
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
## 2018-01-11 NOTE — ED Notes (Signed)
Dr. Adriana Simasook notified of elevated BP at time of discharge. Patient instructed to keep checking BP at home and follow up with physician as per Dr. Adriana Simasook.

## 2018-01-11 NOTE — Discharge Instructions (Signed)
Prescription for antibiotic.  Also recommend over-the-counter ibuprofen 4 tablets 3 times a day and Benadryl 3 times a day.

## 2018-01-11 NOTE — ED Triage Notes (Signed)
Patient reports migraine since Saturday. Adds that ears are now hurting, chills, and cough that is dry. Pains get worse at night

## 2018-07-05 ENCOUNTER — Emergency Department (HOSPITAL_COMMUNITY)
Admission: EM | Admit: 2018-07-05 | Discharge: 2018-07-05 | Disposition: A | Discharge status: Home / Self Care | Payer: No Typology Code available for payment source | Attending: Emergency Medicine | Admitting: Emergency Medicine

## 2018-07-05 ENCOUNTER — Other Ambulatory Visit: Payer: Self-pay

## 2018-07-05 ENCOUNTER — Encounter (HOSPITAL_COMMUNITY): Payer: Self-pay

## 2018-07-05 DIAGNOSIS — J45909 Unspecified asthma, uncomplicated: Secondary | ICD-10-CM | POA: Insufficient documentation

## 2018-07-05 DIAGNOSIS — Z79899 Other long term (current) drug therapy: Secondary | ICD-10-CM | POA: Insufficient documentation

## 2018-07-05 DIAGNOSIS — F17210 Nicotine dependence, cigarettes, uncomplicated: Secondary | ICD-10-CM | POA: Insufficient documentation

## 2018-07-05 DIAGNOSIS — H6692 Otitis media, unspecified, left ear: Secondary | ICD-10-CM | POA: Insufficient documentation

## 2018-07-05 DIAGNOSIS — H6690 Otitis media, unspecified, unspecified ear: Secondary | ICD-10-CM

## 2018-07-05 DIAGNOSIS — G43909 Migraine, unspecified, not intractable, without status migrainosus: Secondary | ICD-10-CM | POA: Insufficient documentation

## 2018-07-05 DIAGNOSIS — R0981 Nasal congestion: Secondary | ICD-10-CM | POA: Insufficient documentation

## 2018-07-05 MED ORDER — AZITHROMYCIN 250 MG PO TABS: ORAL_TABLET | ORAL | 0 refills | Status: DC

## 2018-07-05 MED ORDER — KETOROLAC TROMETHAMINE 60 MG/2ML IM SOLN
60.0000 mg | Freq: Once | INTRAMUSCULAR | Status: AC
  Administered 2018-07-05: 60 mg via INTRAMUSCULAR
  Filled 2018-07-05: qty 2

## 2018-07-05 MED ORDER — DEXAMETHASONE SODIUM PHOSPHATE 10 MG/ML IJ SOLN
10.0000 mg | Freq: Once | INTRAMUSCULAR | Status: AC
  Administered 2018-07-05: 10 mg via INTRAMUSCULAR
  Filled 2018-07-05: qty 1

## 2018-07-05 MED ORDER — PROMETHAZINE HCL 25 MG PO TABS: 25.0000 mg | ORAL_TABLET | Freq: Four times a day (QID) | ORAL | 0 refills | Status: DC | PRN

## 2018-07-05 MED ORDER — TRAMADOL HCL 50 MG PO TABS
50.0000 mg | ORAL_TABLET | Freq: Four times a day (QID) | ORAL | 0 refills | Status: DC | PRN
Start: 2018-07-05 — End: 2019-08-23

## 2018-07-05 NOTE — ED Triage Notes (Signed)
Pt presents to ED from home for migraine. Pt reports migraine since Sunday. +nausea, +light/sound sensitivity.
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
## 2018-10-30 ENCOUNTER — Emergency Department (HOSPITAL_COMMUNITY): Admission: EM | Admit: 2018-10-30 | Discharge: 2018-10-30 | Discharge status: Against Medical Advice | Payer: Self-pay

## 2018-10-30 IMAGING — CR DG CHEST 2V
2 series · 2 of 2 positions shown · non-contrast
Comparison: 10/18/2014

CLINICAL DATA: Shortness of breath and cough

EXAM:
CHEST  2 VIEW

[w chest pa]
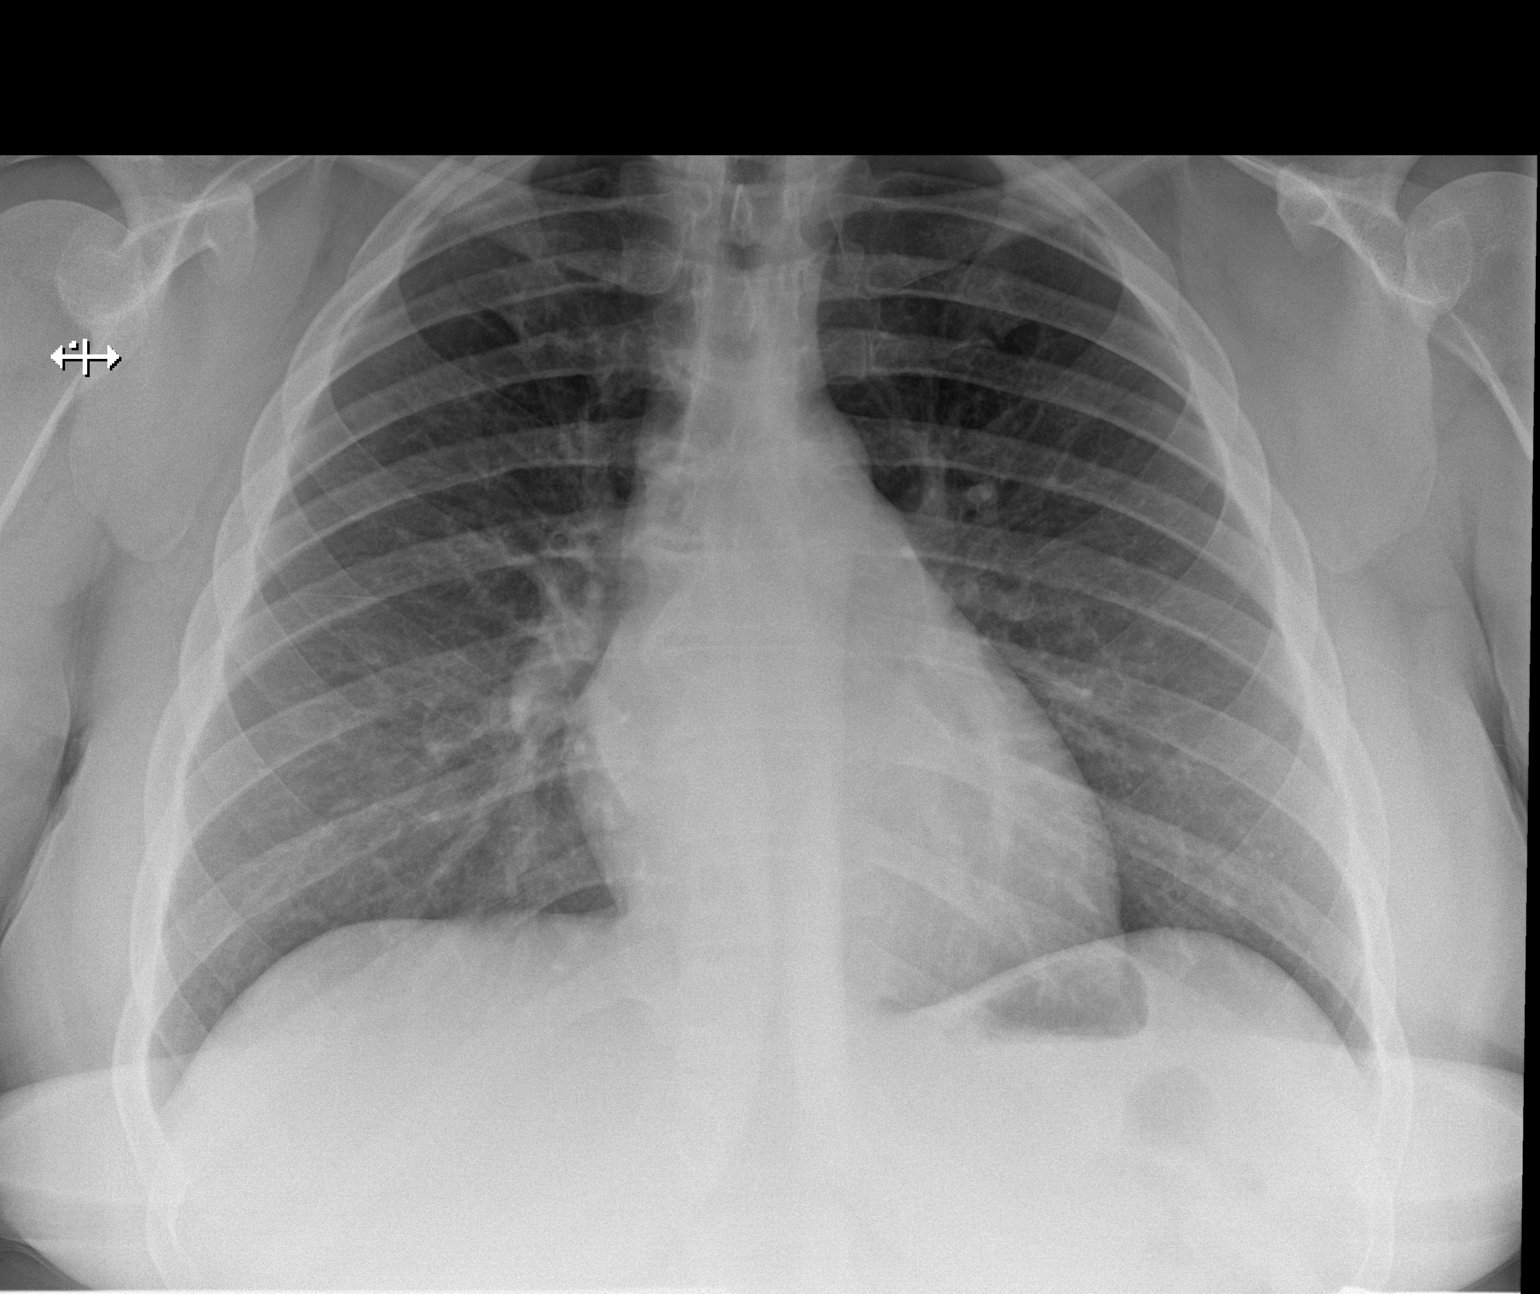

[w chest lat]
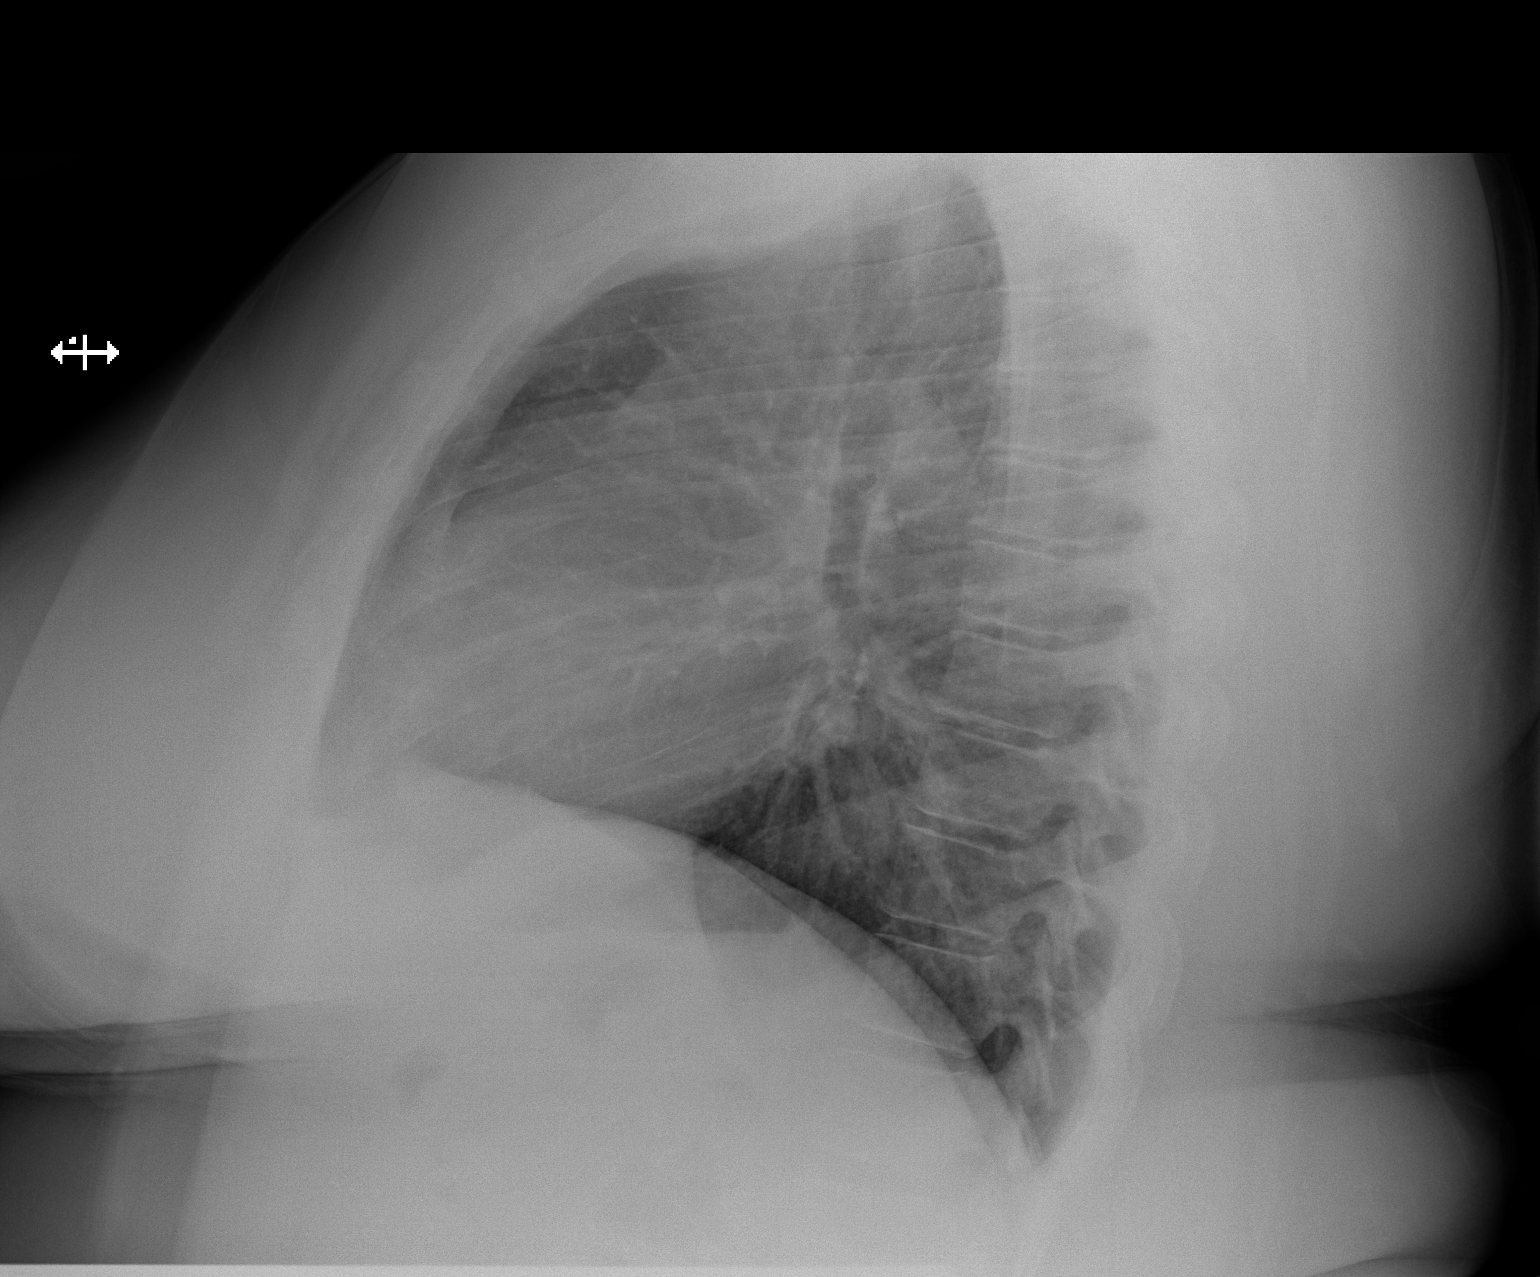

[2 of 2 positions shown; findings below may reference images not displayed]

FINDINGS: The heart size and mediastinal contours are within normal limits.
Both lungs are clear. The visualized skeletal structures are
unremarkable.
IMPRESSION: No active cardiopulmonary disease.

## 2018-10-31 ENCOUNTER — Emergency Department (HOSPITAL_COMMUNITY)
Admission: EM | Admit: 2018-10-31 | Discharge: 2018-10-31 | Disposition: A | Discharge status: Home / Self Care | Payer: Self-pay | Attending: Emergency Medicine | Admitting: Emergency Medicine

## 2018-10-31 ENCOUNTER — Encounter (HOSPITAL_COMMUNITY): Payer: Self-pay | Admitting: *Deleted

## 2018-10-31 ENCOUNTER — Other Ambulatory Visit: Payer: Self-pay

## 2018-10-31 DIAGNOSIS — H9209 Otalgia, unspecified ear: Secondary | ICD-10-CM | POA: Insufficient documentation

## 2018-10-31 DIAGNOSIS — R0981 Nasal congestion: Secondary | ICD-10-CM | POA: Insufficient documentation

## 2018-10-31 DIAGNOSIS — F17210 Nicotine dependence, cigarettes, uncomplicated: Secondary | ICD-10-CM | POA: Insufficient documentation

## 2018-10-31 DIAGNOSIS — R51 Headache: Secondary | ICD-10-CM | POA: Insufficient documentation

## 2018-10-31 DIAGNOSIS — Z209 Contact with and (suspected) exposure to unspecified communicable disease: Secondary | ICD-10-CM | POA: Insufficient documentation

## 2018-10-31 DIAGNOSIS — R519 Headache, unspecified: Secondary | ICD-10-CM

## 2018-10-31 DIAGNOSIS — J45909 Unspecified asthma, uncomplicated: Secondary | ICD-10-CM | POA: Insufficient documentation

## 2018-10-31 DIAGNOSIS — R0982 Postnasal drip: Secondary | ICD-10-CM | POA: Insufficient documentation

## 2018-10-31 DIAGNOSIS — Z79899 Other long term (current) drug therapy: Secondary | ICD-10-CM | POA: Insufficient documentation

## 2018-10-31 MED ORDER — KETOROLAC TROMETHAMINE 60 MG/2ML IM SOLN
60.0000 mg | Freq: Once | INTRAMUSCULAR | Status: AC
  Administered 2018-10-31: 60 mg via INTRAMUSCULAR
  Filled 2018-10-31: qty 2

## 2018-10-31 MED ORDER — DEXAMETHASONE SODIUM PHOSPHATE 10 MG/ML IJ SOLN
10.0000 mg | Freq: Once | INTRAMUSCULAR | Status: AC
  Administered 2018-10-31: 10 mg via INTRAMUSCULAR
  Filled 2018-10-31: qty 1

## 2018-10-31 MED ORDER — BUTALBITAL-APAP-CAFFEINE 50-325-40 MG PO TABS: 1.0000 | ORAL_TABLET | Freq: Four times a day (QID) | ORAL | 0 refills | Status: DC | PRN

## 2018-10-31 NOTE — Discharge Instructions (Signed)
Take the prescribed medication as directed if migraine headache recurs. Follow-up with your primary care doctor. Return to the ED for new or worsening symptoms.

## 2018-10-31 NOTE — ED Notes (Signed)
Pt previously stated she did not file her prescription last time she was seen due to the cost, this nurse showed her the [employer] coupons for her prescription at discharge. Instructions given.

## 2018-10-31 NOTE — ED Triage Notes (Signed)
Pt c/o headache that started Saturday night, now has neck pain which usually happens when I have migraines but I lost insurance and haven't been able to see him again."
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
## 2018-11-07 ENCOUNTER — Other Ambulatory Visit: Payer: Self-pay

## 2018-11-07 ENCOUNTER — Encounter (HOSPITAL_COMMUNITY): Payer: Self-pay | Admitting: Emergency Medicine

## 2018-11-07 ENCOUNTER — Emergency Department (HOSPITAL_COMMUNITY)
Admission: EM | Admit: 2018-11-07 | Discharge: 2018-11-07 | Disposition: A | Discharge status: Home / Self Care | Payer: Self-pay | Attending: Emergency Medicine | Admitting: Emergency Medicine

## 2018-11-07 DIAGNOSIS — F329 Major depressive disorder, single episode, unspecified: Secondary | ICD-10-CM | POA: Insufficient documentation

## 2018-11-07 DIAGNOSIS — G43809 Other migraine, not intractable, without status migrainosus: Secondary | ICD-10-CM | POA: Insufficient documentation

## 2018-11-07 DIAGNOSIS — J45909 Unspecified asthma, uncomplicated: Secondary | ICD-10-CM | POA: Insufficient documentation

## 2018-11-07 DIAGNOSIS — F419 Anxiety disorder, unspecified: Secondary | ICD-10-CM | POA: Insufficient documentation

## 2018-11-07 DIAGNOSIS — F17210 Nicotine dependence, cigarettes, uncomplicated: Secondary | ICD-10-CM | POA: Insufficient documentation

## 2018-11-07 MED ORDER — KETOROLAC TROMETHAMINE 15 MG/ML IJ SOLN
30.0000 mg | Freq: Once | INTRAMUSCULAR | Status: AC
  Administered 2018-11-07: 30 mg via INTRAVENOUS
  Filled 2018-11-07: qty 2

## 2018-11-07 MED ORDER — BUTALBITAL-APAP-CAFFEINE 50-325-40 MG PO TABS
1.0000 | ORAL_TABLET | Freq: Once | ORAL | Status: AC
  Administered 2018-11-07: 1 via ORAL
  Filled 2018-11-07: qty 1

## 2018-11-07 MED ORDER — SODIUM CHLORIDE 0.9 % IV BOLUS
500.0000 mL | Freq: Once | INTRAVENOUS | Status: AC
  Administered 2018-11-07: 500 mL via INTRAVENOUS

## 2018-11-07 MED ORDER — DEXAMETHASONE SODIUM PHOSPHATE 10 MG/ML IJ SOLN
10.0000 mg | Freq: Once | INTRAMUSCULAR | Status: AC
  Administered 2018-11-07: 10 mg via INTRAVENOUS
  Filled 2018-11-07: qty 1

## 2018-11-07 NOTE — ED Triage Notes (Signed)
Pt reports migraine that has been ongoing since last night with no relief from prescription medication.

## 2018-11-07 NOTE — Discharge Instructions (Addendum)
Please continue with your home medications to help treat your headaches.   Please follow up with your primary care provider within 5-7 days for re-evaluation of your symptoms. If you do not have a primary care provider, information for a healthcare clinic has been provided for you to make arrangements for follow up care. Please return to the emergency department for any new or worsening symptoms.
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
## 2018-11-26 ENCOUNTER — Other Ambulatory Visit: Payer: Self-pay

## 2018-11-26 ENCOUNTER — Emergency Department (HOSPITAL_COMMUNITY): Payer: Self-pay

## 2018-11-26 ENCOUNTER — Emergency Department (HOSPITAL_COMMUNITY)
Admission: EM | Admit: 2018-11-26 | Discharge: 2018-11-26 | Disposition: A | Discharge status: Home / Self Care | Payer: Self-pay | Attending: Emergency Medicine | Admitting: Emergency Medicine

## 2018-11-26 ENCOUNTER — Encounter (HOSPITAL_COMMUNITY): Payer: Self-pay | Admitting: Emergency Medicine

## 2018-11-26 DIAGNOSIS — F17210 Nicotine dependence, cigarettes, uncomplicated: Secondary | ICD-10-CM | POA: Insufficient documentation

## 2018-11-26 DIAGNOSIS — Z209 Contact with and (suspected) exposure to unspecified communicable disease: Secondary | ICD-10-CM | POA: Insufficient documentation

## 2018-11-26 DIAGNOSIS — H9203 Otalgia, bilateral: Secondary | ICD-10-CM | POA: Insufficient documentation

## 2018-11-26 DIAGNOSIS — Z79899 Other long term (current) drug therapy: Secondary | ICD-10-CM | POA: Insufficient documentation

## 2018-11-26 DIAGNOSIS — J45909 Unspecified asthma, uncomplicated: Secondary | ICD-10-CM | POA: Insufficient documentation

## 2018-11-26 DIAGNOSIS — J219 Acute bronchiolitis, unspecified: Secondary | ICD-10-CM | POA: Insufficient documentation

## 2018-11-26 MED ORDER — FLUTICASONE PROPIONATE 50 MCG/ACT NA SUSP: 1.0000 | Freq: Every day | NASAL | 2 refills | Status: DC

## 2018-11-26 MED ORDER — BENZONATATE 100 MG PO CAPS: 100.0000 mg | ORAL_CAPSULE | Freq: Three times a day (TID) | ORAL | 0 refills | Status: DC

## 2018-11-26 MED ORDER — PREDNISONE 20 MG PO TABS: 40.0000 mg | ORAL_TABLET | Freq: Every day | ORAL | 0 refills | Status: AC

## 2018-11-26 MED ORDER — ALBUTEROL SULFATE HFA 108 (90 BASE) MCG/ACT IN AERS: 1.0000 | INHALATION_SPRAY | Freq: Four times a day (QID) | RESPIRATORY_TRACT | 3 refills | Status: DC | PRN

## 2018-11-26 NOTE — ED Triage Notes (Signed)
Pt c/o cough that is sometimes productive, facial and sinus pains, ear pains that started yesterday. As well as chills.

## 2018-11-26 NOTE — ED Notes (Signed)
Bed: WTR7 Expected date:  Expected time:  Means of arrival:  Comments: 

## 2018-11-26 NOTE — Discharge Instructions (Signed)
Take prednisone, Flonase, Tessalon Perles as directed.  Use albuterol inhaler as directed.  You can take Tylenol or Ibuprofen as directed for pain. You can alternate Tylenol and Ibuprofen every 4 hours. If you take Tylenol at 1pm, then you can take Ibuprofen at 5pm. Then you can take Tylenol again at 9pm.   Make sure you are getting plenty of rest and drinking plenty of fluids.  Return the emergency department for any difficulty breathing, chest pain, vomiting or any other worsening or concerning symptoms.
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
## 2019-02-18 ENCOUNTER — Other Ambulatory Visit: Payer: Self-pay

## 2019-02-18 ENCOUNTER — Encounter (HOSPITAL_COMMUNITY): Payer: Self-pay

## 2019-02-18 ENCOUNTER — Emergency Department (HOSPITAL_COMMUNITY)
Admission: EM | Admit: 2019-02-18 | Discharge: 2019-02-18 | Disposition: A | Discharge status: Home / Self Care | Payer: Self-pay | Attending: Emergency Medicine | Admitting: Emergency Medicine

## 2019-02-18 DIAGNOSIS — N764 Abscess of vulva: Secondary | ICD-10-CM | POA: Insufficient documentation

## 2019-02-18 DIAGNOSIS — Z23 Encounter for immunization: Secondary | ICD-10-CM | POA: Insufficient documentation

## 2019-02-18 DIAGNOSIS — Z79899 Other long term (current) drug therapy: Secondary | ICD-10-CM | POA: Insufficient documentation

## 2019-02-18 DIAGNOSIS — L0291 Cutaneous abscess, unspecified: Secondary | ICD-10-CM

## 2019-02-18 DIAGNOSIS — J45909 Unspecified asthma, uncomplicated: Secondary | ICD-10-CM | POA: Insufficient documentation

## 2019-02-18 DIAGNOSIS — F17210 Nicotine dependence, cigarettes, uncomplicated: Secondary | ICD-10-CM | POA: Insufficient documentation

## 2019-02-18 MED ORDER — DOXYCYCLINE HYCLATE 100 MG PO CAPS: 100.0000 mg | ORAL_CAPSULE | Freq: Two times a day (BID) | ORAL | 0 refills | Status: AC

## 2019-02-18 MED ORDER — TETANUS-DIPHTH-ACELL PERTUSSIS 5-2.5-18.5 LF-MCG/0.5 IM SUSP
0.5000 mL | Freq: Once | INTRAMUSCULAR | Status: AC
  Administered 2019-02-18: 0.5 mL via INTRAMUSCULAR
  Filled 2019-02-18: qty 0.5

## 2019-02-18 MED ORDER — LIDOCAINE HCL (PF) 1 % IJ SOLN
10.0000 mL | Freq: Once | INTRAMUSCULAR | Status: AC
  Administered 2019-02-18: 10 mL
  Filled 2019-02-18: qty 30

## 2019-02-18 NOTE — ED Triage Notes (Signed)
Patient c/o abscess to the vaginal area x 3 days.

## 2019-02-18 NOTE — Discharge Instructions (Signed)
Take antibiotics as prescribed.  Take the entire course, even if your symptoms improve. Use Tylenol and ibuprofen as needed for pain. Use warm compresses to help with pain and swelling. Try and keep the areas clean as you can, wash once a day with soap and water. You likely have continued bleeding and drainage for the next several days, this is normal. Return to the emergency room if you develop fevers, worsening pain, increasing pus draining from the area, or any new, worsening, or concerning symptoms.
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
## 2019-08-22 ENCOUNTER — Other Ambulatory Visit: Payer: Self-pay

## 2019-08-22 DIAGNOSIS — Z20822 Contact with and (suspected) exposure to covid-19: Secondary | ICD-10-CM

## 2019-08-23 ENCOUNTER — Ambulatory Visit (HOSPITAL_COMMUNITY)
Admission: EM | Admit: 2019-08-23 | Discharge: 2019-08-23 | Disposition: A | Discharge status: Home / Self Care | Payer: BC Managed Care – PPO | Attending: Family Medicine | Admitting: Family Medicine

## 2019-08-23 ENCOUNTER — Encounter (HOSPITAL_COMMUNITY): Payer: Self-pay

## 2019-08-23 ENCOUNTER — Other Ambulatory Visit: Payer: Self-pay

## 2019-08-23 DIAGNOSIS — H6690 Otitis media, unspecified, unspecified ear: Secondary | ICD-10-CM

## 2019-08-23 DIAGNOSIS — J069 Acute upper respiratory infection, unspecified: Secondary | ICD-10-CM | POA: Diagnosis not present

## 2019-08-23 MED ORDER — DEXAMETHASONE SODIUM PHOSPHATE 10 MG/ML IJ SOLN
INTRAMUSCULAR | Status: AC
  Filled 2019-08-23: qty 1

## 2019-08-23 MED ORDER — BENZONATATE 100 MG PO CAPS: 100.0000 mg | ORAL_CAPSULE | Freq: Three times a day (TID) | ORAL | 0 refills | Status: DC

## 2019-08-23 MED ORDER — DEXAMETHASONE SODIUM PHOSPHATE 10 MG/ML IJ SOLN
10.0000 mg | Freq: Once | INTRAMUSCULAR | Status: AC
  Administered 2019-08-23: 17:00:00 10 mg via INTRAMUSCULAR

## 2019-08-23 MED ORDER — FLUTICASONE PROPIONATE 50 MCG/ACT NA SUSP: 1.0000 | Freq: Every day | NASAL | 2 refills | Status: DC

## 2019-08-23 MED ORDER — CETIRIZINE HCL 10 MG PO TABS: 10.0000 mg | ORAL_TABLET | Freq: Every day | ORAL | 0 refills | Status: DC

## 2019-08-23 MED ORDER — AMOXICILLIN 500 MG PO CAPS: 1000.0000 mg | ORAL_CAPSULE | Freq: Three times a day (TID) | ORAL | 0 refills | Status: AC

## 2019-08-23 NOTE — Discharge Instructions (Addendum)
Treating you for ear infection and upper respiratory infection.  Take the medication as prescribed.  Use your inhaler as needed.  Follow up as needed for continued or worsening symptoms

## 2019-08-23 NOTE — ED Triage Notes (Signed)
Pt present she loss her voice on Tuesday with some nasal congestion and coughing. Pt has already been tested for covid-19 yesterday. Results has not been documented. Pt has tried otc medication with no relief.

## 2019-08-24 LAB — NOVEL CORONAVIRUS, NAA: SARS-CoV-2, NAA: NOT DETECTED
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
## 2019-08-29 ENCOUNTER — Encounter (HOSPITAL_COMMUNITY): Payer: Self-pay | Admitting: Emergency Medicine

## 2019-08-29 ENCOUNTER — Ambulatory Visit (HOSPITAL_COMMUNITY)
Admission: EM | Admit: 2019-08-29 | Discharge: 2019-08-29 | Disposition: A | Discharge status: Home / Self Care | Payer: BC Managed Care – PPO

## 2019-08-29 ENCOUNTER — Other Ambulatory Visit: Payer: Self-pay

## 2019-08-29 DIAGNOSIS — J4530 Mild persistent asthma, uncomplicated: Secondary | ICD-10-CM | POA: Diagnosis not present

## 2019-08-29 DIAGNOSIS — R070 Pain in throat: Secondary | ICD-10-CM | POA: Diagnosis not present

## 2019-08-29 DIAGNOSIS — R05 Cough: Secondary | ICD-10-CM

## 2019-08-29 DIAGNOSIS — J3089 Other allergic rhinitis: Secondary | ICD-10-CM | POA: Diagnosis not present

## 2019-08-29 DIAGNOSIS — B349 Viral infection, unspecified: Secondary | ICD-10-CM | POA: Diagnosis not present

## 2019-08-29 DIAGNOSIS — R058 Other specified cough: Secondary | ICD-10-CM

## 2019-08-29 DIAGNOSIS — R0789 Other chest pain: Secondary | ICD-10-CM

## 2019-08-29 MED ORDER — METHYLPREDNISOLONE ACETATE 80 MG/ML IJ SUSP
80.0000 mg | Freq: Once | INTRAMUSCULAR | Status: AC
  Administered 2019-08-29: 10:00:00 80 mg via INTRAMUSCULAR

## 2019-08-29 MED ORDER — METHYLPREDNISOLONE ACETATE 80 MG/ML IJ SUSP
INTRAMUSCULAR | Status: AC
  Filled 2019-08-29: qty 1

## 2019-08-29 MED ORDER — PROMETHAZINE-DM 6.25-15 MG/5ML PO SYRP: 5.0000 mL | ORAL_SOLUTION | Freq: Three times a day (TID) | ORAL | 0 refills | Status: DC | PRN
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
## 2019-08-29 NOTE — ED Triage Notes (Signed)
Onset of symptoms 9/22  ucc saw patient on 9/24 covid test resulted as negative on 9/25  Patient has not seen any improvement.  Has cough, lethargic, throat is "burning" and asthma is "acting up" patient is having nausea

## 2019-11-20 ENCOUNTER — Other Ambulatory Visit: Payer: Self-pay

## 2019-11-20 ENCOUNTER — Emergency Department (HOSPITAL_COMMUNITY)
Admission: EM | Admit: 2019-11-20 | Discharge: 2019-11-20 | Discharge status: Against Medical Advice | Payer: BC Managed Care – PPO

## 2019-11-21 ENCOUNTER — Encounter (HOSPITAL_COMMUNITY): Payer: Self-pay

## 2019-11-21 ENCOUNTER — Ambulatory Visit (HOSPITAL_COMMUNITY)
Admission: EM | Admit: 2019-11-21 | Discharge: 2019-11-21 | Disposition: A | Discharge status: Home / Self Care | Payer: BC Managed Care – PPO | Attending: Emergency Medicine | Admitting: Emergency Medicine

## 2019-11-21 DIAGNOSIS — G43009 Migraine without aura, not intractable, without status migrainosus: Secondary | ICD-10-CM

## 2019-11-21 DIAGNOSIS — J329 Chronic sinusitis, unspecified: Secondary | ICD-10-CM

## 2019-11-21 DIAGNOSIS — H7291 Unspecified perforation of tympanic membrane, right ear: Secondary | ICD-10-CM | POA: Diagnosis not present

## 2019-11-21 DIAGNOSIS — B9789 Other viral agents as the cause of diseases classified elsewhere: Secondary | ICD-10-CM | POA: Diagnosis not present

## 2019-11-21 MED ORDER — NEOMYCIN-POLYMYXIN-HC 3.5-10000-1 OT SUSP: 4.0000 [drp] | Freq: Three times a day (TID) | OTIC | 0 refills | Status: DC

## 2019-11-21 MED ORDER — CYCLOBENZAPRINE HCL 5 MG PO TABS: 5.0000 mg | ORAL_TABLET | Freq: Three times a day (TID) | ORAL | 0 refills | Status: DC | PRN

## 2019-11-21 NOTE — ED Triage Notes (Signed)
Patient presents to Urgent Care with complaints of nasal and ear drainage as well as migraine since 4 days ago. Patient reports she would not like to be tested for covid today.
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
## 2019-11-21 NOTE — Discharge Instructions (Addendum)
Advised patient to continue to take Flonase and Zyrtec as prescribed To continue to use ibuprofen for pain Increase water intake Use Corticosporin drops as prescribed To return for worsening of symptoms or follow-up with ENT

## 2020-01-11 ENCOUNTER — Ambulatory Visit (INDEPENDENT_AMBULATORY_CARE_PROVIDER_SITE_OTHER)
Admission: RE | Admit: 2020-01-11 | Discharge: 2020-01-11 | Disposition: A | Discharge status: Home / Self Care | Payer: BC Managed Care – PPO | Source: Ambulatory Visit

## 2020-01-11 ENCOUNTER — Other Ambulatory Visit: Payer: Self-pay

## 2020-01-11 DIAGNOSIS — R0981 Nasal congestion: Secondary | ICD-10-CM
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
## 2020-01-11 NOTE — Discharge Instructions (Signed)
Push fluids to ensure adequate hydration and keep secretions thin.  Tylenol and/or ibuprofen as needed for pain or fevers.  Continue with daily flonase. May try switching over the counter  nasacort to see if this is more effective for you.  Mucinex as an expectorant may also be helpful.  Intranasal Nasal Saline every few hours or Neti Pot may be very helpful with your symptoms as well.  If symptoms worsen or do not improve in the next week to be seen  in person or to follow up with your PCP.

## 2020-01-30 ENCOUNTER — Encounter (HOSPITAL_COMMUNITY): Payer: Self-pay

## 2020-01-30 ENCOUNTER — Ambulatory Visit (HOSPITAL_COMMUNITY)
Admission: EM | Admit: 2020-01-30 | Discharge: 2020-01-30 | Disposition: A | Discharge status: Home / Self Care | Payer: BC Managed Care – PPO | Attending: Family Medicine | Admitting: Family Medicine

## 2020-01-30 ENCOUNTER — Other Ambulatory Visit: Payer: Self-pay

## 2020-01-30 DIAGNOSIS — R110 Nausea: Secondary | ICD-10-CM

## 2020-01-30 DIAGNOSIS — J0110 Acute frontal sinusitis, unspecified: Secondary | ICD-10-CM

## 2020-01-30 DIAGNOSIS — G43011 Migraine without aura, intractable, with status migrainosus: Secondary | ICD-10-CM | POA: Diagnosis not present

## 2020-01-30 MED ORDER — ONDANSETRON 4 MG PO TBDP
ORAL_TABLET | ORAL | Status: AC
  Filled 2020-01-30: qty 1

## 2020-01-30 MED ORDER — KETOROLAC TROMETHAMINE 60 MG/2ML IM SOLN
INTRAMUSCULAR | Status: AC
  Filled 2020-01-30: qty 2

## 2020-01-30 MED ORDER — ONDANSETRON HCL 4 MG/2ML IJ SOLN: 4.0000 mg | Freq: Once | INTRAMUSCULAR | Status: DC

## 2020-01-30 MED ORDER — KETOROLAC TROMETHAMINE 60 MG/2ML IM SOLN
60.0000 mg | Freq: Once | INTRAMUSCULAR | Status: AC
  Administered 2020-01-30: 60 mg via INTRAMUSCULAR

## 2020-01-30 MED ORDER — FLUCONAZOLE 200 MG PO TABS: 200.0000 mg | ORAL_TABLET | Freq: Once | ORAL | 0 refills | Status: AC

## 2020-01-30 MED ORDER — ONDANSETRON HCL 4 MG PO TABS: 4.0000 mg | ORAL_TABLET | Freq: Four times a day (QID) | ORAL | 0 refills | Status: DC

## 2020-01-30 MED ORDER — AMOXICILLIN-POT CLAVULANATE 875-125 MG PO TABS: 1.0000 | ORAL_TABLET | Freq: Two times a day (BID) | ORAL | 0 refills | Status: AC
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
## 2020-01-30 NOTE — ED Triage Notes (Signed)
Pt state she has chronic migriane and sinus pressure this has been going on for 2 weeks. Pt states she has  nausea and sensitive  to light.

## 2020-01-30 NOTE — Discharge Instructions (Signed)
You  have a sinus infection and I have sent in Augmentin twice a day for 10 days.  I have also sent in Zofran for your nausea.  You received Toradol 60mg  in our office for your head pain and zofran in our office.   Follow up with primary care provider as needed.

## 2020-02-13 ENCOUNTER — Emergency Department (HOSPITAL_COMMUNITY): Payer: BC Managed Care – PPO

## 2020-02-13 ENCOUNTER — Other Ambulatory Visit: Payer: Self-pay

## 2020-02-13 ENCOUNTER — Emergency Department (HOSPITAL_COMMUNITY)
Admission: EM | Admit: 2020-02-13 | Discharge: 2020-02-13 | Disposition: A | Discharge status: Home / Self Care | Payer: BC Managed Care – PPO | Attending: Emergency Medicine | Admitting: Emergency Medicine

## 2020-02-13 DIAGNOSIS — I10 Essential (primary) hypertension: Secondary | ICD-10-CM | POA: Insufficient documentation

## 2020-02-13 DIAGNOSIS — R42 Dizziness and giddiness: Secondary | ICD-10-CM | POA: Insufficient documentation

## 2020-02-13 DIAGNOSIS — J45909 Unspecified asthma, uncomplicated: Secondary | ICD-10-CM | POA: Insufficient documentation

## 2020-02-13 DIAGNOSIS — G43909 Migraine, unspecified, not intractable, without status migrainosus: Secondary | ICD-10-CM

## 2020-02-13 DIAGNOSIS — R519 Headache, unspecified: Secondary | ICD-10-CM | POA: Diagnosis present

## 2020-02-13 DIAGNOSIS — Z79899 Other long term (current) drug therapy: Secondary | ICD-10-CM | POA: Insufficient documentation

## 2020-02-13 DIAGNOSIS — F17210 Nicotine dependence, cigarettes, uncomplicated: Secondary | ICD-10-CM | POA: Diagnosis not present

## 2020-02-13 DIAGNOSIS — H538 Other visual disturbances: Secondary | ICD-10-CM | POA: Diagnosis not present

## 2020-02-13 LAB — CBC WITH DIFFERENTIAL/PLATELET
Abs Immature Granulocytes: 0.05 10*3/uL (ref 0.00–0.07)
Basophils Absolute: 0 10*3/uL (ref 0.0–0.1)
Basophils Relative: 0 %
Eosinophils Absolute: 0.2 10*3/uL (ref 0.0–0.5)
Eosinophils Relative: 1 %
HCT: 44.2 % (ref 36.0–46.0)
Hemoglobin: 14.5 g/dL (ref 12.0–15.0)
Immature Granulocytes: 0 %
Lymphocytes Relative: 30 %
Lymphs Abs: 3.4 10*3/uL (ref 0.7–4.0)
MCH: 27.9 pg (ref 26.0–34.0)
MCHC: 32.8 g/dL (ref 30.0–36.0)
MCV: 85 fL (ref 80.0–100.0)
Monocytes Absolute: 0.6 10*3/uL (ref 0.1–1.0)
Monocytes Relative: 6 %
Neutro Abs: 7 10*3/uL (ref 1.7–7.7)
Neutrophils Relative %: 63 %
Platelets: 407 10*3/uL — ABNORMAL HIGH (ref 150–400)
RBC: 5.2 MIL/uL — ABNORMAL HIGH (ref 3.87–5.11)
RDW: 13.6 % (ref 11.5–15.5)
WBC: 11.3 10*3/uL — ABNORMAL HIGH (ref 4.0–10.5)
nRBC: 0 % (ref 0.0–0.2)

## 2020-02-13 LAB — BASIC METABOLIC PANEL
Anion gap: 10 (ref 5–15)
BUN: 9 mg/dL (ref 6–20)
CO2: 22 mmol/L (ref 22–32)
Calcium: 8.8 mg/dL — ABNORMAL LOW (ref 8.9–10.3)
Chloride: 108 mmol/L (ref 98–111)
Creatinine, Ser: 0.91 mg/dL (ref 0.44–1.00)
GFR calc Af Amer: >60 mL/min (ref >60)
GFR calc non Af Amer: >60 mL/min (ref >60)
Glucose, Bld: 103 mg/dL — ABNORMAL HIGH (ref 70–99)
Potassium: 4.5 mmol/L (ref 3.5–5.1)
Sodium: 140 mmol/L (ref 135–145)

## 2020-02-13 LAB — I-STAT BETA HCG BLOOD, ED (MC, WL, AP ONLY): I-stat hCG, quantitative: <5 m[IU]/mL (ref <5)

## 2020-02-13 MED ORDER — PROCHLORPERAZINE EDISYLATE 10 MG/2ML IJ SOLN
10.0000 mg | Freq: Once | INTRAMUSCULAR | Status: AC
  Administered 2020-02-13: 10 mg via INTRAVENOUS
  Filled 2020-02-13: qty 2

## 2020-02-13 MED ORDER — MECLIZINE HCL 25 MG PO TABS: 25.0000 mg | ORAL_TABLET | Freq: Three times a day (TID) | ORAL | 0 refills | Status: DC | PRN

## 2020-02-13 MED ORDER — HYDROCHLOROTHIAZIDE 25 MG PO TABS: 25.0000 mg | ORAL_TABLET | Freq: Every day | ORAL | 0 refills | Status: AC

## 2020-02-13 MED ORDER — DIPHENHYDRAMINE HCL 50 MG/ML IJ SOLN
25.0000 mg | Freq: Once | INTRAMUSCULAR | Status: AC
  Administered 2020-02-13: 25 mg via INTRAVENOUS
  Filled 2020-02-13: qty 1

## 2020-02-13 MED ORDER — SODIUM CHLORIDE 0.9 % IV BOLUS
1000.0000 mL | Freq: Once | INTRAVENOUS | Status: AC
  Administered 2020-02-13: 1000 mL via INTRAVENOUS

## 2020-02-13 NOTE — Discharge Instructions (Addendum)
Return here as needed. Follow up with your PCP.  °

## 2020-02-13 NOTE — ED Notes (Signed)
Per GYN, states BP 170/100 with headache and visual changes
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
## 2020-02-13 NOTE — ED Triage Notes (Signed)
Pt sent here by ob/gyn from her yearly check for htn, blurred vision, vertigo, and headache x a few weeks. Hx of migraines, followed by a neurologist. Blood pressure has been elevated x 1 year, but pt became concerned with combination of migraine and htn.

## 2020-02-27 ENCOUNTER — Ambulatory Visit (INDEPENDENT_AMBULATORY_CARE_PROVIDER_SITE_OTHER): Payer: BC Managed Care – PPO | Admitting: Primary Care

## 2020-05-15 ENCOUNTER — Other Ambulatory Visit: Payer: Self-pay

## 2020-05-15 ENCOUNTER — Encounter (HOSPITAL_COMMUNITY): Payer: Self-pay | Admitting: Emergency Medicine

## 2020-05-15 ENCOUNTER — Ambulatory Visit (HOSPITAL_COMMUNITY)
Admission: EM | Admit: 2020-05-15 | Discharge: 2020-05-15 | Disposition: A | Discharge status: Home / Self Care | Payer: BC Managed Care – PPO | Attending: Emergency Medicine | Admitting: Emergency Medicine

## 2020-05-15 DIAGNOSIS — H9201 Otalgia, right ear: Secondary | ICD-10-CM

## 2020-05-15 DIAGNOSIS — J069 Acute upper respiratory infection, unspecified: Secondary | ICD-10-CM

## 2020-05-15 DIAGNOSIS — R05 Cough: Secondary | ICD-10-CM | POA: Diagnosis not present

## 2020-05-15 DIAGNOSIS — J3489 Other specified disorders of nose and nasal sinuses: Secondary | ICD-10-CM

## 2020-05-15 DIAGNOSIS — R059 Cough, unspecified: Secondary | ICD-10-CM

## 2020-05-15 MED ORDER — ALBUTEROL SULFATE HFA 108 (90 BASE) MCG/ACT IN AERS: 1.0000 | INHALATION_SPRAY | Freq: Four times a day (QID) | RESPIRATORY_TRACT | 1 refills | Status: AC | PRN

## 2020-05-15 MED ORDER — AZITHROMYCIN 250 MG PO TABS: 250.0000 mg | ORAL_TABLET | Freq: Every day | ORAL | 0 refills | Status: DC

## 2020-05-15 NOTE — Discharge Instructions (Signed)
Use humidifier as needed  Take tylenol as needed for pain  We will refill your inhaler use as needed for sob  Will treat for sinus infections  Cont to take daily medications
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
## 2020-05-15 NOTE — ED Triage Notes (Signed)
Patient presents to urgent care today with symptoms of sinus drainage and voice changes and cough. Symptoms began on 3 days ago. They have tried drinking hot tea and cough drops with no relief of symptoms.

## 2020-05-30 ENCOUNTER — Encounter: Payer: Self-pay | Admitting: Neurology

## 2020-08-27 ENCOUNTER — Ambulatory Visit: Payer: BC Managed Care – PPO | Admitting: Neurology

## 2020-08-29 ENCOUNTER — Encounter (HOSPITAL_COMMUNITY): Payer: Self-pay

## 2020-08-29 ENCOUNTER — Ambulatory Visit (HOSPITAL_COMMUNITY)
Admission: EM | Admit: 2020-08-29 | Discharge: 2020-08-29 | Disposition: A | Discharge status: Home / Self Care | Payer: 59 | Attending: Family Medicine | Admitting: Family Medicine

## 2020-08-29 ENCOUNTER — Other Ambulatory Visit: Payer: Self-pay

## 2020-08-29 DIAGNOSIS — H9201 Otalgia, right ear: Secondary | ICD-10-CM

## 2020-08-29 DIAGNOSIS — H66001 Acute suppurative otitis media without spontaneous rupture of ear drum, right ear: Secondary | ICD-10-CM

## 2020-08-29 MED ORDER — AMOXICILLIN 875 MG PO TABS: 875.0000 mg | ORAL_TABLET | Freq: Two times a day (BID) | ORAL | 0 refills | Status: AC

## 2020-08-29 MED ORDER — BENZONATATE 100 MG PO CAPS: ORAL_CAPSULE | ORAL | 0 refills | Status: DC

## 2020-08-29 NOTE — ED Triage Notes (Signed)
Pt present pain in both ears with right ear drainage and sinus pressure with facial pain. Symptoms started a week ago.
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
## 2020-12-11 ENCOUNTER — Other Ambulatory Visit: Payer: Self-pay

## 2020-12-11 ENCOUNTER — Encounter (HOSPITAL_COMMUNITY): Payer: Self-pay

## 2020-12-11 ENCOUNTER — Ambulatory Visit (HOSPITAL_COMMUNITY)
Admission: EM | Admit: 2020-12-11 | Discharge: 2020-12-11 | Disposition: A | Discharge status: Home / Self Care | Payer: 59 | Attending: Urgent Care | Admitting: Urgent Care

## 2020-12-11 DIAGNOSIS — J3489 Other specified disorders of nose and nasal sinuses: Secondary | ICD-10-CM | POA: Diagnosis present

## 2020-12-11 DIAGNOSIS — J3089 Other allergic rhinitis: Secondary | ICD-10-CM | POA: Diagnosis present

## 2020-12-11 DIAGNOSIS — R519 Headache, unspecified: Secondary | ICD-10-CM | POA: Insufficient documentation

## 2020-12-11 DIAGNOSIS — Z20822 Contact with and (suspected) exposure to covid-19: Secondary | ICD-10-CM | POA: Insufficient documentation

## 2020-12-11 DIAGNOSIS — H9203 Otalgia, bilateral: Secondary | ICD-10-CM | POA: Diagnosis present

## 2020-12-11 DIAGNOSIS — R0981 Nasal congestion: Secondary | ICD-10-CM | POA: Insufficient documentation

## 2020-12-11 LAB — SARS CORONAVIRUS 2 (TAT 6-24 HRS): SARS Coronavirus 2: NEGATIVE

## 2020-12-11 MED ORDER — PREDNISONE 20 MG PO TABS: ORAL_TABLET | ORAL | 0 refills | Status: DC

## 2020-12-11 NOTE — ED Triage Notes (Signed)
Pt c/o sinus pressure, headache x 4 days. Pt states she woke up with nasal congestion. She states when she blows her nose she sees a small amount of blood. Pt states she has been having ear aches for two months. Pt states she has tried to take OTC sinus medicine but does not have relief. Pt states she has had a sore throat that comes and goes.
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
## 2020-12-12 ENCOUNTER — Ambulatory Visit: Payer: 59 | Admitting: Neurology

## 2021-03-19 ENCOUNTER — Other Ambulatory Visit: Payer: Self-pay | Admitting: Orthopedic Surgery

## 2021-03-19 DIAGNOSIS — M545 Low back pain, unspecified: Secondary | ICD-10-CM

## 2021-03-31 ENCOUNTER — Other Ambulatory Visit: Payer: Self-pay

## 2021-03-31 ENCOUNTER — Ambulatory Visit
Admission: RE | Admit: 2021-03-31 | Discharge: 2021-03-31 | Disposition: A | Discharge status: Home / Self Care | Payer: 59 | Source: Ambulatory Visit | Attending: Orthopedic Surgery | Admitting: Orthopedic Surgery

## 2021-03-31 DIAGNOSIS — M545 Low back pain, unspecified: Secondary | ICD-10-CM

## 2022-07-24 ENCOUNTER — Encounter (HOSPITAL_COMMUNITY): Payer: Self-pay

## 2022-07-24 ENCOUNTER — Other Ambulatory Visit: Payer: Self-pay

## 2022-07-24 ENCOUNTER — Emergency Department (HOSPITAL_COMMUNITY)
Admission: EM | Admit: 2022-07-24 | Discharge: 2022-07-24 | Disposition: A | Discharge status: Home / Self Care | Payer: No Typology Code available for payment source | Attending: Emergency Medicine | Admitting: Emergency Medicine

## 2022-07-24 DIAGNOSIS — J029 Acute pharyngitis, unspecified: Secondary | ICD-10-CM | POA: Diagnosis present

## 2022-07-24 DIAGNOSIS — U071 COVID-19: Secondary | ICD-10-CM

## 2022-07-24 DIAGNOSIS — H6690 Otitis media, unspecified, unspecified ear: Secondary | ICD-10-CM

## 2022-07-24 LAB — SARS CORONAVIRUS 2 BY RT PCR: SARS Coronavirus 2 by RT PCR: POSITIVE — AB

## 2022-07-24 MED ORDER — BENZONATATE 100 MG PO CAPS: ORAL_CAPSULE | ORAL | 0 refills | Status: DC

## 2022-07-24 NOTE — Discharge Instructions (Addendum)
Continue to take the antibiotic given to you previously.  It is the correct treatment for your infection.  You may read the information about ear infections attached to these discharge papers.  I sent cough medication to your pharmacy.  You may continue to use other over-the-counter medications for your symptoms.  Return with any severe chest pain or shortness of breath.  Your school note is attached.  It was a pleasure to meet you and I hope that you feel better.

## 2022-07-24 NOTE — ED Provider Notes (Signed)
[facility]-EMERGENCY DEPT Provider Note   CSN: [id number] Arrival date & time: 07/24/22  0457     History  Chief Complaint  Patient presents with   Sore Throat    Shawna Kennedy is a 41 y.o. female presenting with URI symptoms since last weekend.  Was seen on a virtual visit and given Augmentin for suspected sinus infection.  Now has been has similar symptoms     Sore Throat Pertinent negatives include no chest pain and no shortness of breath.       Home Medications Prior to Admission medications   Medication Sig Start Date End Date Taking? Authorizing Provider  albuterol (VENTOLIN HFA) 108 (90 Base) MCG/ACT inhaler Inhale 1-2 puffs into the lungs every 6 (six) hours as needed for wheezing or shortness of breath. 05/15/20   Coralyn Mark, NP  benzonatate (TESSALON) 100 MG capsule Take 1 capsule by mouth every 8 (eight) hours for cough. 08/29/20   Mardella Layman, MD  buPROPion (WELLBUTRIN SR) 150 MG 12 hr tablet Take 150 mg by mouth 2 (two) times daily. 10/10/19   [provider]  busPIRone (BUSPAR) 10 MG tablet Take 10 mg by mouth 2 (two) times daily.    [provider]  cetirizine (ZYRTEC) 10 MG tablet Take 1 tablet (10 mg total) by mouth daily. 08/23/19   Dahlia Byes A, NP  cyclobenzaprine (FLEXERIL) 5 MG tablet Take 1 tablet (5 mg total) by mouth 3 (three) times daily as needed for muscle spasms. 11/21/19   Avegno, Zachery Dakins, FNP  hydrochlorothiazide (HYDRODIURIL) 25 MG tablet Take 1 tablet (25 mg total) by mouth daily. 02/13/20   Lawyer, Cristal Deer, PA-C  hydrOXYzine (ATARAX/VISTARIL) 25 MG tablet Take 25-50 mg by mouth at bedtime as needed for sleep. 01/23/20   [provider]  ibuprofen (ADVIL,MOTRIN) 200 MG tablet Take 800 mg by mouth every 6 (six) hours as needed for moderate pain.    [provider]  meclizine (ANTIVERT) 25 MG tablet Take 1 tablet (25 mg total) by mouth 3 (three) times daily as needed for  dizziness. 02/13/20   Lawyer, Cristal Deer, PA-C  Melatonin 5 MG TABS Take 5 mg by mouth at bedtime.    [provider]  montelukast (SINGULAIR) 10 MG tablet Take 10 mg by mouth at bedtime.    [provider]  ondansetron (ZOFRAN) 4 MG tablet Take 1 tablet (4 mg total) by mouth every 6 (six) hours. 01/30/20   Moshe Cipro, NP  predniSONE (DELTASONE) 20 MG tablet Take 2 tablets daily with breakfast. 12/11/20   Wallis Bamberg, PA-C  rizatriptan (MAXALT) 10 MG tablet Take 10 mg by mouth daily as needed for migraine. 02/07/20   [provider]  topiramate (TOPAMAX) 25 MG tablet Take 25-50 mg by mouth See admin instructions. Take one tablet at bedtime for one week, then increase to two tablets at bedtime. 02/07/20   [provider]  Vitamin D, Ergocalciferol, (DRISDOL) 1.25 MG (50000 UNIT) CAPS capsule Take 50,000 Units by mouth every Sunday.    [provider]  fluticasone (FLONASE) 50 MCG/ACT nasal spray Place 1 spray into both nostrils daily. Patient not taking: Reported on 02/13/2020 08/23/19 12/11/20  Dahlia Byes A, NP  omeprazole (PRILOSEC) 20 MG capsule Take 1 capsule (20 mg total) by mouth daily. Patient not taking: Reported on 10/31/2018 11/01/16 08/23/19  [place], Betty G, MD  promethazine (PHENERGAN) 25 MG tablet Take 1 tablet (25 mg total) by mouth every 6 (six) hours as  needed for nausea or vomiting (migraine). Patient not taking: Reported on 10/31/2018 07/05/18 08/23/19  Gilda Crease, MD      Allergies    Ciprofloxacin hcl, Omni-pac, Penicillins, and Sulfa antibiotics    Review of Systems   Review of Systems  Constitutional:  Negative for chills and fever.  HENT:  Negative for congestion and sore throat.   Respiratory:  Negative for shortness of breath.   Cardiovascular:  Negative for chest pain.  Gastrointestinal:  Negative for diarrhea and vomiting.  Musculoskeletal:  Positive for myalgias.    Physical Exam Updated Vital Signs BP (!)  155/113 (BP Location: Left Arm) Comment: Pt reports missing her PM dose of HTN meds - Triage RN aware  Pulse 95   Temp 99.9 F (37.7 C) (Oral)   Resp 20   Ht 5\' 7"  (1.702 m)   Wt (!) 154.2 kg   SpO2 97%   BMI 53.25 kg/m  Physical Exam Vitals and nursing note reviewed.  Constitutional:      Appearance: Normal appearance.  HENT:     Head: Normocephalic and atraumatic.     Right Ear: Tympanic membrane and ear canal normal. No swelling or tenderness.     Left Ear: Swelling present. No tenderness.  No middle ear effusion. Tympanic membrane is erythematous.  Eyes:     General: No scleral icterus.    Conjunctiva/sclera: Conjunctivae normal.  Pulmonary:     Effort: Pulmonary effort is normal. No respiratory distress.  Skin:    Findings: No rash.  Neurological:     Mental Status: She is alert.  Psychiatric:        Mood and Affect: Mood normal.     ED Results / Procedures / Treatments   Labs (all labs ordered are listed, but only abnormal results are displayed) Labs Reviewed  SARS CORONAVIRUS 2 BY RT PCR - Abnormal; Notable for the following components:      Result Value   SARS Coronavirus 2 by RT PCR POSITIVE (*)    All other components within normal limits    EKG None  Radiology No results found.  Procedures Procedures   Medications Ordered in ED Medications - No data to display  ED Course/ Medical Decision Making/ A&P                           Medical Decision Making Risk Prescription drug management.   41 year old female presenting today with URI symptoms.  Been present for a week.  Was started on Augmentin for presumed sinusitis on a virtual visit.  Testing: COVID-positive today  Treatment: Afebrile, did not require medications at this time  MDM/disposition: Patient is a 41 year old healthy female who presented today due to URI symptoms.  Her COVID is positive, as is her husband's.  I will send her 46 for her cough.  No other potential  symptoms of PE.  She will continue to use over-the-counter medications and follow-up outpatient as needed.  Additionally, she will continue to take Augmentin because it will cover her for her left-sided otitis media.  She is agreeable to this plan.  School note provided   Final Clinical Impression(s) / ED Diagnoses Final diagnoses:  COVID-19    Rx / DC Orders ED Discharge Orders     None      Results and diagnoses were explained to the patient. Return precautions discussed in full. Patient had no additional questions and expressed complete understanding.   This  chart was dictated using voice recognition software.  Despite best efforts to proofread,  errors can occur which can change the documentation meaning.    Saddie Benders, PA-C 07/24/22 2248    Phoebe Sharps, DO 07/24/22 1028

## 2022-07-24 NOTE — ED Triage Notes (Signed)
Generalized body aches, sore throat, fever, cough, and sinus congestion since Monday. Taking abx with no improvement.

## 2022-08-14 ENCOUNTER — Encounter (HOSPITAL_BASED_OUTPATIENT_CLINIC_OR_DEPARTMENT_OTHER): Payer: Self-pay

## 2022-08-14 ENCOUNTER — Emergency Department (HOSPITAL_BASED_OUTPATIENT_CLINIC_OR_DEPARTMENT_OTHER)
Admission: EM | Admit: 2022-08-14 | Discharge: 2022-08-14 | Disposition: A | Discharge status: Home / Self Care | Payer: No Typology Code available for payment source | Attending: Emergency Medicine | Admitting: Emergency Medicine

## 2022-08-14 ENCOUNTER — Other Ambulatory Visit: Payer: Self-pay

## 2022-08-14 DIAGNOSIS — H6692 Otitis media, unspecified, left ear: Secondary | ICD-10-CM | POA: Insufficient documentation

## 2022-08-14 DIAGNOSIS — H6690 Otitis media, unspecified, unspecified ear: Secondary | ICD-10-CM

## 2022-08-14 DIAGNOSIS — H9202 Otalgia, left ear: Secondary | ICD-10-CM | POA: Diagnosis present

## 2022-08-14 MED ORDER — AMOXICILLIN-POT CLAVULANATE 875-125 MG PO TABS: 1.0000 | ORAL_TABLET | Freq: Two times a day (BID) | ORAL | 0 refills | Status: DC

## 2022-08-14 NOTE — ED Provider Notes (Signed)
[facility] EMERGENCY DEPT Provider Note   CSN: [id number] Arrival date & time: 08/14/22  1438     History Chief Complaint  Patient presents with   Otalgia    Shawna Kennedy is a 41 y.o. female patient who presents to the emergency department today for further evaluation of left ear pain for the last 3 weeks.  She was originally put on amoxicillin for a sinus infection and she was having ear pain at that time.  This was ultimately a misdiagnosis as she was diagnosed with COVID roughly a week later.  She was still having ear pain and they told her to take the rest of her amoxicillin.  The rest of her symptoms have improved although she still having some nasal congestion but the ear pain has been persistent for 3 weeks.  She denies any fevers or chills.   Otalgia      Home Medications Prior to Admission medications   Medication Sig Start Date End Date Taking? Authorizing Provider  amoxicillin-clavulanate (AUGMENTIN) 875-125 MG tablet Take 1 tablet by mouth every 12 (twelve) hours. 08/14/22  Yes Meredeth Ide, Kristin Barcus M, PA-C  albuterol (VENTOLIN HFA) 108 (90 Base) MCG/ACT inhaler Inhale 1-2 puffs into the lungs every 6 (six) hours as needed for wheezing or shortness of breath. 05/15/20   Coralyn Mark, NP  benzonatate (TESSALON) 100 MG capsule Take 1 capsule by mouth every 8 (eight) hours for cough. 07/24/22   Redwine, Madison A, PA-C  buPROPion (WELLBUTRIN SR) 150 MG 12 hr tablet Take 150 mg by mouth 2 (two) times daily. 10/10/19   [provider]  busPIRone (BUSPAR) 10 MG tablet Take 10 mg by mouth 2 (two) times daily.    [provider]  cetirizine (ZYRTEC) 10 MG tablet Take 1 tablet (10 mg total) by mouth daily. 08/23/19   Dahlia Byes A, NP  cyclobenzaprine (FLEXERIL) 5 MG tablet Take 1 tablet (5 mg total) by mouth 3 (three) times daily as needed for muscle spasms. 11/21/19   Avegno, Zachery Dakins, FNP  hydrochlorothiazide (HYDRODIURIL) 25 MG tablet  Take 1 tablet (25 mg total) by mouth daily. 02/13/20   Lawyer, Cristal Deer, PA-C  hydrOXYzine (ATARAX/VISTARIL) 25 MG tablet Take 25-50 mg by mouth at bedtime as needed for sleep. 01/23/20   [provider]  ibuprofen (ADVIL,MOTRIN) 200 MG tablet Take 800 mg by mouth every 6 (six) hours as needed for moderate pain.    [provider]  meclizine (ANTIVERT) 25 MG tablet Take 1 tablet (25 mg total) by mouth 3 (three) times daily as needed for dizziness. 02/13/20   Lawyer, Cristal Deer, PA-C  Melatonin 5 MG TABS Take 5 mg by mouth at bedtime.    [provider]  montelukast (SINGULAIR) 10 MG tablet Take 10 mg by mouth at bedtime.    [provider]  ondansetron (ZOFRAN) 4 MG tablet Take 1 tablet (4 mg total) by mouth every 6 (six) hours. 01/30/20   Moshe Cipro, NP  predniSONE (DELTASONE) 20 MG tablet Take 2 tablets daily with breakfast. 12/11/20   Wallis Bamberg, PA-C  rizatriptan (MAXALT) 10 MG tablet Take 10 mg by mouth daily as needed for migraine. 02/07/20   [provider]  topiramate (TOPAMAX) 25 MG tablet Take 25-50 mg by mouth See admin instructions. Take one tablet at bedtime for one week, then increase to two tablets at bedtime. 02/07/20   [provider]  Vitamin D, Ergocalciferol, (DRISDOL) 1.25 MG (50000 UNIT) CAPS capsule Take 50,000 Units by  mouth every Sunday.    [provider]  fluticasone (FLONASE) 50 MCG/ACT nasal spray Place 1 spray into both nostrils daily. Patient not taking: Reported on 02/13/2020 08/23/19 12/11/20  Dahlia Byes A, NP  omeprazole (PRILOSEC) 20 MG capsule Take 1 capsule (20 mg total) by mouth daily. Patient not taking: Reported on 10/31/2018 11/01/16 08/23/19  [place], Betty G, MD  promethazine (PHENERGAN) 25 MG tablet Take 1 tablet (25 mg total) by mouth every 6 (six) hours as needed for nausea or vomiting (migraine). Patient not taking: Reported on 10/31/2018 07/05/18 08/23/19  Gilda Crease, MD       Allergies    Ciprofloxacin hcl, Omni-pac, Penicillins, and Sulfa antibiotics    Review of Systems   Review of Systems  HENT:  Positive for ear pain.   All other systems reviewed and are negative.   Physical Exam Updated Vital Signs BP (!) 124/54 (BP Location: Right Arm)   Pulse 96   Temp 98.3 F (36.8 C) (Oral)   Resp 18   Ht 5\' 7"  (1.702 m)   Wt (!) 154.2 kg   LMP 08/09/2022   SpO2 100%   BMI 53.25 kg/m  Physical Exam Vitals and nursing note reviewed.  Constitutional:      Appearance: Normal appearance.  HENT:     Head: Normocephalic and atraumatic.     Ears:     Comments: Left TM is bulging and erythematous.  External ear is normal bilaterally.  The right TM is not edematous or erythematous.  There is evidence of TM perforation on the right. Eyes:     General:        Right eye: No discharge.        Left eye: No discharge.     Conjunctiva/sclera: Conjunctivae normal.  Pulmonary:     Effort: Pulmonary effort is normal.  Skin:    General: Skin is warm and dry.     Findings: No rash.  Neurological:     General: No focal deficit present.     Mental Status: She is alert.  Psychiatric:        Mood and Affect: Mood normal.        Behavior: Behavior normal.     ED Results / Procedures / Treatments   Labs (all labs ordered are listed, but only abnormal results are displayed) Labs Reviewed - No data to display  EKG None  Radiology No results found.  Procedures Procedures    Medications Ordered in ED Medications - No data to display  ED Course/ Medical Decision Making/ A&P Clinical Course as of 08/14/22 1707  Sat Aug 14, 2022  1707 Patient does not have a penicillin allergy she just finished a course of amoxicillin without any difficulty. [CF]    Clinical Course User Index [CF] Aug 16, 2022, PA-C                           Medical Decision Making Shawna Kennedy is a 41 y.o. female patient who presents to the emergency department today  for further evaluation of left ear pain.  This is consistent with otitis media.  Since she was just on amoxicillin I will prescribe her Augmentin for the next 7 days.  There is evidence of TM perforation on the right.  This is known to the patient.  Vital signs are normal.  She is safe for discharge at this time.  Strict return precautions were discussed.  Risk Prescription drug management.   Final Clinical Impression(s) / ED Diagnoses Final diagnoses:  Acute otitis media, unspecified otitis media type    Rx / DC Orders ED Discharge Orders          Ordered    amoxicillin-clavulanate (AUGMENTIN) 875-125 MG tablet  Every 12 hours        08/14/22 1707              Myna Bright [place], PA-C 08/14/22 1711    Wyvonnia Dusky, MD 08/15/22 1013

## 2022-08-14 NOTE — Discharge Instructions (Addendum)
Please take antibiotics as prescribed.  This is stronger and should take care of your ear infection.  Follow-up with your primary care doctor if you have one.  May return to the emergency room at any time for any worsening symptoms.

## 2022-08-14 NOTE — ED Triage Notes (Signed)
Pt states three weeks ago, she was put on abx for a sinus infection. She was dx with COVID and ear infection shortly thereafter.  Pt states she feels that she still has a left ear infection. Pt had taken all abx prescribed.

## 2023-09-29 ENCOUNTER — Ambulatory Visit (HOSPITAL_COMMUNITY)
Admission: EM | Admit: 2023-09-29 | Discharge: 2023-09-29 | Disposition: A | Discharge status: Home / Self Care | Payer: No Typology Code available for payment source | Attending: Family Medicine | Admitting: Family Medicine

## 2023-09-29 ENCOUNTER — Encounter (HOSPITAL_COMMUNITY): Payer: Self-pay

## 2023-09-29 DIAGNOSIS — J019 Acute sinusitis, unspecified: Secondary | ICD-10-CM | POA: Diagnosis not present

## 2023-09-29 DIAGNOSIS — H7291 Unspecified perforation of tympanic membrane, right ear: Secondary | ICD-10-CM | POA: Diagnosis not present

## 2023-09-29 MED ORDER — CEFDINIR 300 MG PO CAPS: 600.0000 mg | ORAL_CAPSULE | Freq: Every day | ORAL | 0 refills | Status: AC

## 2023-09-29 MED ORDER — FLUCONAZOLE 150 MG PO TABS: 150.0000 mg | ORAL_TABLET | ORAL | 0 refills | Status: AC

## 2023-09-29 MED ORDER — BENZONATATE 100 MG PO CAPS: 100.0000 mg | ORAL_CAPSULE | Freq: Three times a day (TID) | ORAL | 0 refills | Status: AC | PRN

## 2023-09-29 MED ORDER — MECLIZINE HCL 25 MG PO TABS: 25.0000 mg | ORAL_TABLET | Freq: Three times a day (TID) | ORAL | 0 refills | Status: AC | PRN

## 2023-09-29 MED ORDER — ONDANSETRON 4 MG PO TBDP: 4.0000 mg | ORAL_TABLET | Freq: Three times a day (TID) | ORAL | 0 refills | Status: AC | PRN

## 2023-09-29 NOTE — ED Triage Notes (Signed)
Pt c/o cough x2 days. States having sinus pressure, headache, bilateral earache, and sore throat x2wks. States taking OTC meds with no relief.

## 2023-09-29 NOTE — Discharge Instructions (Signed)
Take cefdinir 300 mg--2 capsules together daily for 7 days  Take benzonatate 100 mg, 1 tab every 8 hours as needed for cough.  Ondansetron dissolved in the mouth every 8 hours as needed for nausea or vomiting.  Take meclizine 25 mg--1 tablet every 8 hours as needed for vertigo  Take fluconazole 150 mg--1 tablet every 3 days for 2 doses
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
## 2023-11-02 ENCOUNTER — Emergency Department (HOSPITAL_BASED_OUTPATIENT_CLINIC_OR_DEPARTMENT_OTHER)
Admission: EM | Admit: 2023-11-02 | Discharge: 2023-11-02 | Disposition: A | Discharge status: Home / Self Care | Payer: No Typology Code available for payment source | Attending: Emergency Medicine | Admitting: Emergency Medicine

## 2023-11-02 ENCOUNTER — Emergency Department (HOSPITAL_BASED_OUTPATIENT_CLINIC_OR_DEPARTMENT_OTHER): Payer: No Typology Code available for payment source | Admitting: Radiology

## 2023-11-02 ENCOUNTER — Other Ambulatory Visit: Payer: Self-pay

## 2023-11-02 DIAGNOSIS — M545 Low back pain, unspecified: Secondary | ICD-10-CM | POA: Diagnosis present

## 2023-11-02 DIAGNOSIS — Z20822 Contact with and (suspected) exposure to covid-19: Secondary | ICD-10-CM | POA: Insufficient documentation

## 2023-11-02 DIAGNOSIS — R0981 Nasal congestion: Secondary | ICD-10-CM | POA: Diagnosis not present

## 2023-11-02 DIAGNOSIS — M5442 Lumbago with sciatica, left side: Secondary | ICD-10-CM

## 2023-11-02 LAB — RESP PANEL BY RT-PCR (RSV, FLU A&B, COVID)  RVPGX2
Influenza A by PCR: NEGATIVE
Influenza B by PCR: NEGATIVE
Resp Syncytial Virus by PCR: NEGATIVE
SARS Coronavirus 2 by RT PCR: NEGATIVE

## 2023-11-02 LAB — PREGNANCY, URINE: Preg Test, Ur: NEGATIVE

## 2023-11-02 MED ORDER — LIDOCAINE 5 % EX PTCH
1.0000 | MEDICATED_PATCH | CUTANEOUS | Status: DC
  Administered 2023-11-02: 1 via TRANSDERMAL
  Filled 2023-11-02: qty 1

## 2023-11-02 MED ORDER — METHOCARBAMOL 500 MG PO TABS: 500.0000 mg | ORAL_TABLET | Freq: Two times a day (BID) | ORAL | 0 refills | Status: AC

## 2023-11-02 MED ORDER — METHOCARBAMOL 500 MG PO TABS
500.0000 mg | ORAL_TABLET | Freq: Once | ORAL | Status: AC
  Administered 2023-11-02: 500 mg via ORAL
  Filled 2023-11-02: qty 1

## 2023-11-02 MED ORDER — KETOROLAC TROMETHAMINE 15 MG/ML IJ SOLN
15.0000 mg | Freq: Once | INTRAMUSCULAR | Status: AC
  Administered 2023-11-02: 15 mg via INTRAMUSCULAR
  Filled 2023-11-02: qty 1

## 2023-11-02 NOTE — ED Provider Notes (Signed)
[place] EMERGENCY DEPARTMENT AT [facility] Provider Note   CSN: [id number] Arrival date & time: 11/02/23  1402     History  Chief Complaint  Patient presents with   Back Pain   Nasal Congestion    Shawna Kennedy is a 42 y.o. female with a history of obesity, low back pain, and anxiety presents the ED today for multiple complaints.  Patient reports she has been having increased low back pain for the past week, since Thanksgiving.  She states she has a history of stenosis, low bone density, and sciatica but it has been under control for the past several months.  She denies any injury or trauma to her back.  She states that she has been taking anti-inflammatories and muscle relaxers for the past several days without improvements of her low back pain.  The pain radiates down her thighs bilaterally.  She denies any saddle anesthesia, weakness, or inability to ambulate.  Additionally, patient reports drainage from the right ear. She states that she has a hole in her ear drum that she is following with ENT for.  She also endorses new onset nasal congestion for the past several days.  She denies fever, vomiting, diarrhea, or sore throat.  Denies recent sick contact. No chest pain or shortness of breath.  No additional complaints or concerns at this time.    Home Medications Prior to Admission medications   Medication Sig Start Date End Date Taking? Authorizing Provider  albuterol (VENTOLIN HFA) 108 (90 Base) MCG/ACT inhaler Inhale 1-2 puffs into the lungs every 6 (six) hours as needed for wheezing or shortness of breath. 05/15/20   Coralyn Mark, NP  benzonatate (TESSALON) 100 MG capsule Take 1 capsule (100 mg total) by mouth 3 (three) times daily as needed for cough. 09/29/23   Zenia Resides, MD  buPROPion (WELLBUTRIN SR) 150 MG 12 hr tablet Take 150 mg by mouth 2 (two) times daily. 10/10/19   [provider]  busPIRone (BUSPAR) 10 MG tablet Take 10 mg by  mouth 2 (two) times daily.    [provider]  hydrochlorothiazide (HYDRODIURIL) 25 MG tablet Take 1 tablet (25 mg total) by mouth daily. 02/13/20   Lawyer, Cristal Deer, PA-C  hydrOXYzine (ATARAX/VISTARIL) 25 MG tablet Take 25-50 mg by mouth at bedtime as needed for sleep. 01/23/20   [provider]  ibuprofen (ADVIL,MOTRIN) 200 MG tablet Take 800 mg by mouth every 6 (six) hours as needed for moderate pain.    [provider]  meclizine (ANTIVERT) 25 MG tablet Take 1 tablet (25 mg total) by mouth 3 (three) times daily as needed for dizziness (vertigo). 09/29/23   Zenia Resides, MD  Melatonin 5 MG TABS Take 5 mg by mouth at bedtime.    [provider]  methocarbamol (ROBAXIN) 500 MG tablet Take 1 tablet (500 mg total) by mouth 2 (two) times daily for 7 days. 11/02/23 11/09/23 Yes Maxwell Marion, PA-C  montelukast (SINGULAIR) 10 MG tablet Take 10 mg by mouth at bedtime.    [provider]  ondansetron (ZOFRAN-ODT) 4 MG disintegrating tablet Take 1 tablet (4 mg total) by mouth every 8 (eight) hours as needed for nausea or vomiting. 09/29/23   Zenia Resides, MD  rizatriptan (MAXALT) 10 MG tablet Take 10 mg by mouth daily as needed for migraine. 02/07/20   [provider]  topiramate (TOPAMAX) 25 MG tablet Take 25-50 mg by mouth See admin instructions. Take one tablet at bedtime for  one week, then increase to two tablets at bedtime. 02/07/20   [provider]  Vitamin D, Ergocalciferol, (DRISDOL) 1.25 MG (50000 UNIT) CAPS capsule Take 50,000 Units by mouth every Sunday.    [provider]  fluticasone (FLONASE) 50 MCG/ACT nasal spray Place 1 spray into both nostrils daily. Patient not taking: Reported on 02/13/2020 08/23/19 12/11/20  Dahlia Byes A, NP  omeprazole (PRILOSEC) 20 MG capsule Take 1 capsule (20 mg total) by mouth daily. Patient not taking: Reported on 10/31/2018 11/01/16 08/23/19  [place], Betty G, MD  promethazine  (PHENERGAN) 25 MG tablet Take 1 tablet (25 mg total) by mouth every 6 (six) hours as needed for nausea or vomiting (migraine). Patient not taking: Reported on 10/31/2018 07/05/18 08/23/19  Gilda Crease, MD      Allergies    Ciprofloxacin hcl and Sulfa antibiotics    Review of Systems   Review of Systems  Musculoskeletal:  Positive for back pain.  All other systems reviewed and are negative.   Physical Exam Updated Vital Signs BP (!) 152/84   Pulse 74   Temp 98 F (36.7 C) (Oral)   Resp 18   LMP 10/22/2023 (Exact Date)   SpO2 99%  Physical Exam Vitals and nursing note reviewed.  Constitutional:      Appearance: Normal appearance.  HENT:     Head: Normocephalic and atraumatic.     Right Ear: Ear canal and external ear normal.     Left Ear: Tympanic membrane, ear canal and external ear normal.     Ears:     Comments: Small hole present at the lateral aspect of the right tympanic membrane with no drainage present.    Mouth/Throat:     Mouth: Mucous membranes are moist.     Pharynx: Oropharynx is clear. No oropharyngeal exudate or posterior oropharyngeal erythema.  Eyes:     Conjunctiva/sclera: Conjunctivae normal.     Pupils: Pupils are equal, round, and reactive to light.  Cardiovascular:     Rate and Rhythm: Normal rate and regular rhythm.     Pulses: Normal pulses.     Heart sounds: Normal heart sounds.  Pulmonary:     Effort: Pulmonary effort is normal.     Breath sounds: Normal breath sounds.  Abdominal:     Palpations: Abdomen is soft.     Tenderness: There is no abdominal tenderness.  Musculoskeletal:        General: Tenderness present. Normal range of motion.     Cervical back: Normal range of motion.     Right lower leg: No edema.     Left lower leg: No edema.     Comments: Tenderness to palpation of the midline lumbar spine without step-off or deformity with associated paravertebral tenderness.  Strength, sensation, and range of motion of upper and  lower extremities intact bilaterally.  Able to ambulate independently.  Skin:    General: Skin is warm and dry.     Findings: No rash.  Neurological:     General: No focal deficit present.     Mental Status: She is alert.     Sensory: No sensory deficit.     Motor: No weakness.     Gait: Gait normal.  Psychiatric:        Mood and Affect: Mood normal.        Behavior: Behavior normal.    ED Results / Procedures / Treatments   Labs (all labs ordered are listed, but only abnormal results are  displayed) Labs Reviewed  RESP PANEL BY RT-PCR (RSV, FLU A&B, COVID)  RVPGX2  PREGNANCY, URINE    EKG None  Radiology DG Lumbar Spine Complete  Result Date: 11/02/2023 CLINICAL DATA:  Low back pain starting around Thanksgiving. Difficulty bending over. EXAM: LUMBAR SPINE - COMPLETE 4+ VIEW COMPARISON:  Lumbar spine radiographs 01/18/2013. MRI lumbar spine 03/31/2021 FINDINGS: Five lumbar type vertebral bodies. Normal alignment. No vertebral compression deformities. No focal bone lesion or bone destruction. Bone cortex appears intact. Degenerative changes with mild disc space narrowing and small endplate osteophyte formation. Mild progression since the older radiograph. Normal alignment of the facet joints. Visualized sacrum appears intact. IMPRESSION: Mild degenerative changes in the lumbar spine. Normal alignment. No acute displaced fractures identified. Electronically Signed   By: Burman Nieves M.D.   On: 11/02/2023 19:29    Procedures Procedures: not indicated.   Medications Ordered in ED Medications  ketorolac (TORADOL) 15 MG/ML injection 15 mg (15 mg Intramuscular Given 11/02/23 1711)  methocarbamol (ROBAXIN) tablet 500 mg (500 mg Oral Given 11/02/23 1952)    ED Course/ Medical Decision Making/ A&P                                 Medical Decision Making Amount and/or Complexity of Data Reviewed Labs: ordered. Radiology: ordered.  Risk Prescription drug management.   This  patient presents to the ED for concern of low back pain and nasal congestion, this involves an extensive number of treatment options, and is a complaint that carries with it a high risk of complications and morbidity.   Differential diagnosis includes: Fracture, dislocation, radiculopathy, disc herniation, URI, flu, COVID, RSV, etc.   Comorbidities  See HPI above   Additional History  Additional history obtained from prior records.   Lab Tests  I ordered and personally interpreted labs.  The pertinent results include:   Negative respiratory panel Negative pregnancy test   Imaging Studies  I ordered imaging studies including lumbar x-ray I independently visualized and interpreted imaging which showed: Mild degenerative changes in the lumbar spine.  Normal alignment.  No acute displaced fractures identified. I agree with the radiologist interpretation   Problem List / ED Course / Critical Interventions / Medication Management  Low back pain radiating to the thighs bilaterally I ordered medications including: Toradol and lidocaine patch for low back pain Reevaluation of the patient after these medicines showed that the patient stayed the same.  I then ordered Robaxin, which showed some improvement. I have reviewed the patients home medicines and have made adjustments as needed   Social Determinants of Health  Physical activity   Test / Admission - Considered  Discussed findings with patient.  She is hemodynamically stable and safe for discharge home. Prescription for Robaxin sent to the pharmacy. Information for orthopedics provided for patient to follow-up with. Return precautions provided.       Final Clinical Impression(s) / ED Diagnoses Final diagnoses:  Acute midline low back pain with bilateral sciatica    Rx / DC Orders ED Discharge Orders          Ordered    methocarbamol (ROBAXIN) 500 MG tablet  2 times daily        11/02/23 1935               Maxwell Marion, PA-C 11/04/23 1610    Glyn Ade, MD 11/07/23 1456

## 2023-11-02 NOTE — ED Notes (Signed)
 RN reviewed discharge instructions with pt. Pt verbalized understanding and had no further questions. VSS upon discharge.  

## 2023-11-02 NOTE — Discharge Instructions (Addendum)
As discussed, your imaging is reassuring.  Alternate between Ibuprofen/voltaren cream and Tylenol every 4 hours. I have sent a prescription for Robaxin to your pharmacy.  Take this medication twice a day.  This medication can cause drowsiness so do not drive or operate heavy machinery while taking this medication.  You can use lidocaine patches over-the-counter as well  Provided information for the orthopedics follow-up with me for back pain persist.  You can take medications like Mucinex to help with your congestion as well as intranasal medications like Astepro OTC.  Get help right away if: You develop severe pain. Your pain suddenly gets worse. You develop increasing weakness in your legs. You lose the ability to control your bladder or bowel. You have difficulty walking or balancing. You have a fever.

## 2023-11-02 NOTE — ED Triage Notes (Addendum)
Lower back pain starting around Thanksgiving. HX arthritis, sciatica, and low bone density. Difficulty bending over.    Also complains of ear fullness and nasal congestion. Afebrile. +N/-V/-D. Denies sore throat. Denies CP SOB. RT at triage for eval.

## 2024-06-27 ENCOUNTER — Other Ambulatory Visit: Payer: Self-pay | Admitting: Family

## 2024-06-27 DIAGNOSIS — G8929 Other chronic pain: Secondary | ICD-10-CM

## 2024-06-27 DIAGNOSIS — Z1231 Encounter for screening mammogram for malignant neoplasm of breast: Secondary | ICD-10-CM

## 2024-10-24 ENCOUNTER — Other Ambulatory Visit (INDEPENDENT_AMBULATORY_CARE_PROVIDER_SITE_OTHER): Payer: Self-pay

## 2024-10-24 ENCOUNTER — Ambulatory Visit: Admitting: Orthopedic Surgery

## 2024-10-24 VITALS — Ht 67.0 in | Wt 340.0 lb

## 2024-10-24 DIAGNOSIS — M5441 Lumbago with sciatica, right side: Secondary | ICD-10-CM | POA: Diagnosis not present

## 2024-10-24 DIAGNOSIS — G8929 Other chronic pain: Secondary | ICD-10-CM

## 2024-10-24 DIAGNOSIS — M5442 Lumbago with sciatica, left side: Secondary | ICD-10-CM
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
## 2024-11-20 ENCOUNTER — Emergency Department (HOSPITAL_BASED_OUTPATIENT_CLINIC_OR_DEPARTMENT_OTHER)
Admission: EM | Admit: 2024-11-20 | Discharge: 2024-11-20 | Disposition: A | Discharge status: Home / Self Care | Attending: Emergency Medicine | Admitting: Emergency Medicine

## 2024-11-20 ENCOUNTER — Other Ambulatory Visit: Payer: Self-pay

## 2024-11-20 ENCOUNTER — Encounter (HOSPITAL_BASED_OUTPATIENT_CLINIC_OR_DEPARTMENT_OTHER): Payer: Self-pay

## 2024-11-20 DIAGNOSIS — R21 Rash and other nonspecific skin eruption: Secondary | ICD-10-CM | POA: Insufficient documentation

## 2024-11-20 MED ORDER — CETIRIZINE HCL 10 MG PO TABS: 10.0000 mg | ORAL_TABLET | Freq: Every day | ORAL | 0 refills | Status: AC

## 2024-11-20 MED ORDER — PREDNISONE 50 MG PO TABS
60.0000 mg | ORAL_TABLET | Freq: Once | ORAL | Status: AC
  Administered 2024-11-20: 60 mg via ORAL
  Filled 2024-11-20: qty 1

## 2024-11-20 MED ORDER — METHYLPREDNISOLONE 4 MG PO TBPK: ORAL_TABLET | ORAL | 0 refills | Status: AC

## 2024-11-20 NOTE — ED Provider Notes (Signed)
 " [place] EMERGENCY DEPARTMENT AT [facility] Provider Note   CSN: [id number] Arrival date & time: 11/20/24  8190    Patient presents with: Rash   Shawna Kennedy is a 43 y.o. female here for evaluation of rash.  She has history of fibromyalgia and when stressed she tends to get rashes.  Noted over the last week or so she developed rash to her anterior chest.  Crosses the midline.  Red, no rash to palms or soles.  No recent lotions, perfumes, detergents.  No shortness of breath, nausea, vomiting.  Had something previous last year, lasted a few weeks and self resolved.  Was seen at that time referred to allergist however due to improvement in symptoms did not see physician at follow-up appointment   HPI     Prior to Admission medications  Medication Sig Start Date End Date Taking? Authorizing Provider  cetirizine  (ZYRTEC  ALLERGY) 10 MG tablet Take 1 tablet (10 mg total) by mouth daily. 11/20/24  Yes Rayvon Brandvold A, PA-C  methylPREDNISolone  (MEDROL  DOSEPAK) 4 MG TBPK tablet Take as prescribed on the box 11/20/24  Yes Laykin Rainone A, PA-C  albuterol  (VENTOLIN  HFA) 108 (90 Base) MCG/ACT inhaler Inhale 1-2 puffs into the lungs every 6 (six) hours as needed for wheezing or shortness of breath. 05/15/20   Merilee Andrea CROME, NP  benzonatate  (TESSALON ) 100 MG capsule Take 1 capsule (100 mg total) by mouth 3 (three) times daily as needed for cough. 09/29/23   Banister, Pamela K, MD  buPROPion (WELLBUTRIN SR) 150 MG 12 hr tablet Take 150 mg by mouth 2 (two) times daily. 10/10/19   [provider]  busPIRone (BUSPAR) 10 MG tablet Take 10 mg by mouth 2 (two) times daily.    [provider]  hydrochlorothiazide  (HYDRODIURIL ) 25 MG tablet Take 1 tablet (25 mg total) by mouth daily. 02/13/20   Lawyer, Lonni, PA-C  hydrOXYzine  (ATARAX /VISTARIL ) 25 MG tablet Take 25-50 mg by mouth at bedtime as needed for sleep. 01/23/20   [provider]  ibuprofen  (ADVIL,MOTRIN) 200 MG tablet Take 800 mg by mouth every 6 (six) hours as needed for moderate pain.    [provider]  meclizine  (ANTIVERT ) 25 MG tablet Take 1 tablet (25 mg total) by mouth 3 (three) times daily as needed for dizziness (vertigo). 09/29/23   Banister, Pamela K, MD  Melatonin 5 MG TABS Take 5 mg by mouth at bedtime.    [provider]  montelukast (SINGULAIR) 10 MG tablet Take 10 mg by mouth at bedtime.    [provider]  ondansetron  (ZOFRAN -ODT) 4 MG disintegrating tablet Take 1 tablet (4 mg total) by mouth every 8 (eight) hours as needed for nausea or vomiting. 09/29/23   Banister, Pamela K, MD  rizatriptan (MAXALT) 10 MG tablet Take 10 mg by mouth daily as needed for migraine. 02/07/20   [provider]  topiramate  (TOPAMAX ) 25 MG tablet Take 25-50 mg by mouth See admin instructions. Take one tablet at bedtime for one week, then increase to two tablets at bedtime. 02/07/20   [provider]  Vitamin D, Ergocalciferol, (DRISDOL) 1.25 MG (50000 UNIT) CAPS capsule Take 50,000 Units by mouth every Sunday.    [provider]  fluticasone  (FLONASE ) 50 MCG/ACT nasal spray Place 1 spray into both nostrils daily. Patient not taking: Reported on 02/13/2020 08/23/19 12/11/20  Adah Wilbert LABOR, FNP  omeprazole  (PRILOSEC) 20 MG capsule Take 1 capsule (20 mg total) by mouth daily. Patient not  taking: Reported on 10/31/2018 11/01/16 08/23/19  Jordan, Betty G, MD  promethazine  (PHENERGAN ) 25 MG tablet Take 1 tablet (25 mg total) by mouth every 6 (six) hours as needed for nausea or vomiting (migraine). Patient not taking: Reported on 10/31/2018 07/05/18 08/23/19  Haze Lonni PARAS, MD    Allergies: Ciprofloxacin hcl and Sulfa  antibiotics    Review of Systems  Constitutional: Negative.   HENT: Negative.    Respiratory: Negative.    Cardiovascular: Negative.   Gastrointestinal: Negative.   Genitourinary: Negative.   Musculoskeletal: Negative.    Skin:  Positive for rash.  Neurological: Negative.   All other systems reviewed and are negative.   Updated Vital Signs BP (!) 148/116 (BP Location: Right Arm)   Pulse 83   Temp 97.8 F (36.6 C)   Resp 16   SpO2 99%   Physical Exam Vitals and nursing note reviewed.  Constitutional:      General: She is not in acute distress.    Appearance: She is well-developed. She is not ill-appearing, toxic-appearing or diaphoretic.  HENT:     Head: Atraumatic.  Eyes:     Pupils: Pupils are equal, round, and reactive to light.  Cardiovascular:     Rate and Rhythm: Normal rate.     Pulses: Normal pulses.     Heart sounds: Normal heart sounds.  Pulmonary:     Effort: Pulmonary effort is normal. No respiratory distress.     Breath sounds: Normal breath sounds.  Abdominal:     General: Bowel sounds are normal. There is no distension.     Palpations: Abdomen is soft.     Tenderness: There is no abdominal tenderness. There is no right CVA tenderness, left CVA tenderness or guarding.  Musculoskeletal:        General: Normal range of motion.     Cervical back: Normal range of motion.  Skin:    General: Skin is warm and dry.     Comments: Erythematous papular rash anterior chest, crosses midline.  No rash to palms or soles.  No vesicles, bulla, target lesions, desquamated skin.  Neurological:     General: No focal deficit present.     Mental Status: She is alert.  Psychiatric:        Mood and Affect: Mood normal.     (all labs ordered are listed, but only abnormal results are displayed) Labs Reviewed - No data to display  EKG: None  Radiology: No results found.   Procedures   Medications Ordered in the ED  predniSONE  (DELTASONE ) tablet 60 mg (60 mg Oral Given 11/20/24 4672)    43 year old here for evaluation of rash.  Over the last week.  Has had some in previous last year which self resolved.  Doing topical medicines without relief.  No rash to palms or soles.  No vesicles,  bulla, desquamated skin.  Rash crosses the midline.  Pruritic in nature.  No evidence of anaphylaxis.  Low suspicion for rheumatologic condition.  Did have viral illness previous.   Rash consistent with dermatitis. Patient denies any difficulty breathing or swallowing.  Pt has a patent airway without stridor and is handling secretions without difficulty; no angioedema. No blisters, no pustules, no warmth, no draining sinus tracts, no superficial abscesses, no bullous impetigo, no vesicles, no desquamation, no target lesions with dusky purpura or a central bulla. Not tender to touch. No concern for superimposed infection. No concern for SJS, TEN, TSS, tick borne illness, syphilis or other life-threatening condition. Will  discharge home with short course of steroids, Zyrtec .                                   Medical Decision Making Amount and/or Complexity of Data Reviewed Independent Historian: spouse External Data Reviewed: labs, radiology and notes.  Risk OTC drugs. Prescription drug management. Decision regarding hospitalization. Diagnosis or treatment significantly limited by social determinants of health.        Final diagnoses:  Rash    ED Discharge Orders          Ordered    methylPREDNISolone  (MEDROL  DOSEPAK) 4 MG TBPK tablet        11/20/24 2118    cetirizine  (ZYRTEC  ALLERGY) 10 MG tablet  Daily        11/20/24 2118               Priest Lockridge A, PA-C 11/20/24 2119    Pamella Ozell LABOR, DO 11/25/24 1948  "

## 2024-11-20 NOTE — ED Triage Notes (Signed)
 Patient arrives with rash. Says she has fibromyalgia and when stressed she gets a rash. She has a hot red rash with hives on her chest. She has been doing OTC creams and antihistamines with no relief. She says it has been getting worse and is more painful.

## 2024-11-20 NOTE — Discharge Instructions (Signed)
 It was a pleasure taking care of you here today.  I am starting on steroids as well as an antihistamine.  Take as prescribed.  Make sure to follow-up with your rheumatologist and PCP.  Return for any worsening symptoms

## 2024-12-03 ENCOUNTER — Encounter: Payer: Self-pay | Admitting: Physical Medicine & Rehabilitation

## 2025-01-04 ENCOUNTER — Encounter: Admitting: Physical Medicine & Rehabilitation

## 2025-01-18 ENCOUNTER — Encounter: Admitting: Physical Medicine & Rehabilitation
